# Patient Record
Sex: Male | Born: 1950
Health system: Southern US, Community
[De-identification: ages and names within clinical notes are randomized; demographics above are authoritative.]

## PROBLEM LIST (undated history)

## (undated) DIAGNOSIS — K635 Polyp of colon: Secondary | ICD-10-CM

## (undated) DIAGNOSIS — K5792 Diverticulitis of intestine, part unspecified, without perforation or abscess without bleeding: Secondary | ICD-10-CM

## (undated) DIAGNOSIS — I10 Essential (primary) hypertension: Secondary | ICD-10-CM

## (undated) DIAGNOSIS — E119 Type 2 diabetes mellitus without complications: Secondary | ICD-10-CM

## (undated) HISTORY — PX: FRACTURE SURGERY: SHX138

## (undated) HISTORY — DX: Essential (primary) hypertension: I10

## (undated) HISTORY — PX: APPENDECTOMY: SHX54

## (undated) HISTORY — DX: Diverticulitis of intestine, part unspecified, without perforation or abscess without bleeding: K57.92

## (undated) HISTORY — DX: Polyp of colon: K63.5

---

## 2002-06-09 ENCOUNTER — Emergency Department (HOSPITAL_COMMUNITY): Admission: EM | Admit: 2002-06-09 | Discharge: 2002-06-09 | Payer: Self-pay | Admitting: Emergency Medicine

## 2004-02-26 ENCOUNTER — Ambulatory Visit (HOSPITAL_COMMUNITY): Admission: RE | Admit: 2004-02-26 | Discharge: 2004-02-26 | Payer: Self-pay | Admitting: *Deleted

## 2004-03-26 ENCOUNTER — Ambulatory Visit: Admission: RE | Admit: 2004-03-26 | Discharge: 2004-03-26 | Payer: Self-pay | Admitting: *Deleted

## 2010-02-09 ENCOUNTER — Encounter (INDEPENDENT_AMBULATORY_CARE_PROVIDER_SITE_OTHER): Payer: Self-pay | Admitting: *Deleted

## 2010-03-19 ENCOUNTER — Encounter (INDEPENDENT_AMBULATORY_CARE_PROVIDER_SITE_OTHER): Payer: Self-pay

## 2010-03-24 ENCOUNTER — Ambulatory Visit
Admission: RE | Admit: 2010-03-24 | Discharge: 2010-03-24 | Payer: Self-pay | Source: Home / Self Care | Attending: Gastroenterology | Admitting: Gastroenterology

## 2010-04-06 ENCOUNTER — Other Ambulatory Visit: Payer: Self-pay | Admitting: Gastroenterology

## 2010-04-06 ENCOUNTER — Ambulatory Visit
Admission: RE | Admit: 2010-04-06 | Discharge: 2010-04-06 | Payer: Self-pay | Source: Home / Self Care | Attending: Gastroenterology | Admitting: Gastroenterology

## 2010-04-08 ENCOUNTER — Telehealth: Payer: Self-pay | Admitting: Gastroenterology

## 2010-04-13 ENCOUNTER — Encounter: Payer: Self-pay | Admitting: Gastroenterology

## 2010-04-21 NOTE — Letter (Signed)
Summary: Pre Visit Letter Revised  [facility]  [address] [place], [place] [address]   Phone: [phone]  Fax: [phone]        02/09/2010 MRN: [id number] [facility] [address] [place], [place]  [address]             Procedure Date:  04-06-10   Welcome to the Gastroenterology Division at [facility].    You are scheduled to see a nurse for your pre-procedure visit on 03-24-10 at 10:00a.m. on the 3rd floor at [facility], [address]  We ask that you try to arrive at our office 15 minutes prior to your appointment time to allow for check-in.  Please take a minute to review the attached form.  If you answer "Yes" to one or more of the questions on the first page, we ask that you call the person listed at your earliest opportunity.  If you answer "No" to all of the questions, please complete the rest of the form and bring it to your appointment.    Your nurse visit will consist of discussing your medical and surgical history, your immediate family medical history, and your medications.   If you are unable to list all of your medications on the form, please bring the medication bottles to your appointment and we will list them.  We will need to be aware of both prescribed and over the counter drugs.  We will need to know exact dosage information as well.    Please be prepared to read and sign documents such as consent forms, a financial agreement, and acknowledgement forms.  If necessary, and with your consent, a friend or relative is welcome to sit-in on the nurse visit with you.  Please bring your insurance card so that we may make a copy of it.  If your insurance requires a referral to see a specialist, please bring your referral form from your primary care physician.  No co-pay is required for this nurse visit.     If you cannot keep your appointment, please call [phone] to cancel or reschedule prior to your appointment date.  This  allows [place] the opportunity to schedule an appointment for another patient in need of care.    Thank you for choosing [facility] for your medical needs.  We appreciate the opportunity to care for you.  Please visit [place] at our website  to learn more about our practice.  Sincerely, The Gastroenterology Division

## 2010-04-23 NOTE — Letter (Signed)
Summary: Results Letter  [facility]  [address] [place], [place] [address]   Phone: [phone]  Fax: [phone]        April 13, 2010 MRN: [id number]    OMAURI BOEVE [address] [place], [place]  [address]    Dear Mr. MATSUMOTO,   The polyps removed during your recent procedure were proven to be adenomatous.  These are pre-cancerous polyps that may have grown into cancers if they had not been removed.  Based on current nationally recognized surveillance guidelines, I recommend that you have a repeat colonoscopy in 3 years.   We will therefore put your information in our reminder system and will contact you in 3 years to schedule a repeat procedure.  Please call if you have any questions or concerns.       Sincerely,  Rachael Fee MD  This letter has been electronically signed by your physician.  Appended Document: Results Letter Letter mailed

## 2010-04-23 NOTE — Procedures (Addendum)
Summary: Colonoscopy  Patient: Keith Day Note: All result statuses are Final unless otherwise noted.  Tests: (1) Colonoscopy (COL)   COL Colonoscopy           DONE     [facility]     [address]     [place], [place]  [address]           COLONOSCOPY PROCEDURE REPORT           PATIENT:  Keith, Day  MR#:  [id number]     BIRTHDATE:  04-09-1950, 59 yrs. old  GENDER:  male     ENDOSCOPIST:  Rachael Fee, MD     REF. BY:  Lynnea Ferrier, M.D.     PROCEDURE DATE:  04/06/2010     PROCEDURE:  Colonoscopy with snare polypectomy     ASA CLASS:  Class II     INDICATIONS:  Routine Risk Screening     MEDICATIONS:   Fentanyl 75 mcg IV, Versed 10 mg IV           DESCRIPTION OF PROCEDURE:   After the risks benefits and     alternatives of the procedure were thoroughly explained, informed     consent was obtained.  Digital rectal exam was performed and     revealed no rectal masses.   The LB PCF-[id number] endoscope     was introduced through the anus and advanced to the cecum, which     was identified by both the appendix and ileocecal valve, without     limitations.  The quality of the prep was good, using MoviPrep.     The instrument was then slowly withdrawn as the colon was fully     examined.     <<PROCEDUREIMAGES>>     FINDINGS:  Four polyps were found, all were removed and all sent     to pathology. One was 3mm across, sessile, transverse colon,     removed with cold snare, pathology jar 1. One was 5mm across,     sessile, descending colon, removed with cold snare, path jar 1.     One was 12mm across, pedunculated, sigmoid colon, removed with     snare/cautery, path jar 2. The last was 25mm, sigmoid colon,     pedunculated, removed with snare/cautery, path jar 3 (see image5,     image7, image8, and image9).  A nodule was found. There was a 5mm     nodule distal to dentate lin, did not appear neoplastic (anal     canal). This was not sampled (see image10).  This  was otherwise a     normal examination of the colon (see image3 and image4).     Retroflexed views in the rectum revealed no abnormalities.    The     scope was then withdrawn from the patient and the procedure     completed.           COMPLICATIONS:  None     ENDOSCOPIC IMPRESSION:     1) Four polyps, all removed and all sent to pathology     2) Anal nodule, did not appear neoplastic but this should be     removed or at least biopsied.     3) Otherwise normal examination           RECOMMENDATIONS:     1) If the polyp(s) removed today are proven to be adenomatous     (pre-cancerous) polyps, you will need a colonoscopy  in 3 years.     Otherwise you should continue to follow colorectal cancer     screening guidelines for "routine risk" patients with a     colonoscopy in 10 years.     2) You will receive a letter within 1-2 weeks with the results     of your biopsy as well as final recommendations. Please call my     office if you have not received a letter after 3 weeks.     3) Dr. Christella Hartigan office will arrange referral to general surgery to     evaluate the anal nodule (biopsy, remove).           ______________________________     Rachael Fee, MD           n.     eSIGNED:   Rachael Fee at 04/06/2010 10:43 AM           Keith Day, [id number]  Note: An exclamation mark (!) indicates a result that was not dispersed into the flowsheet. Document Creation Date: 04/06/2010 10:43 AM _______________________________________________________________________  (1) Order result status: Final Collection or observation date-time: 04/06/2010 10:36 Requested date-time:  Receipt date-time:  Reported date-time:  Referring Physician:   Ordering Physician: Rob Bunting [phone]) Specimen Source:  Source: Launa Grill Order Number: [id number] Lab site:   Appended Document: Colonoscopy     Procedures Next Due Date:    Colonoscopy: 04/2013

## 2010-04-23 NOTE — Progress Notes (Signed)
Summary: Schedule surgical referral  Phone Note Outgoing Call   Summary of Call: Appt. made at [facility] for biopsy and removal of anal nodule with Dr.Gross for 04/20/10.Is to arrive at 3:45 p.m. for 4:15 appt.Message left for pt. to call back QM:VHQI.Records faxed accept for path.report on polyp Initial call taken by: Teryl Lucy RN,  April 08, 2010 12:43 PM  Follow-up for Phone Call        Left message for patient to call back Darcey Nora RN, [facility]  April 09, 2010 9:15 AM  Left message for patient to call back Darcey Nora RN, [facility]  April 09, 2010 5:02 PM   I spoke with the patient's wife this am. Relayed appointment information to her.  She will pass the information on to the patient  Follow-up by: Darcey Nora RN, CGRN,  April 10, 2010 9:17 AM

## 2010-04-23 NOTE — Letter (Signed)
Summary: [facility] Instructions  [facility]  [address] [place], [place] [address]   Phone: [phone]  Fax: [phone]       Keith Day    29-Mar-1950    MRN: [id number]        Procedure Day /Date: Monday 04/06/2010     Arrival Time: 9:30 am     Procedure Time:  10:30 am     Location of Procedure:                    _x _  [facility] (4th Floor)                        PREPARATION FOR COLONOSCOPY WITH MOVIPREP   Starting 5 days prior to your procedure Wednesday 1/11 do not eat nuts, seeds, popcorn, corn, beans, peas,  salads, or any raw vegetables.  Do not take any fiber supplements (e.g. Metamucil, Citrucel, and Benefiber).  THE DAY BEFORE YOUR PROCEDURE         DATE: Sunday 1/15  1.  Drink clear liquids the entire day-NO SOLID FOOD  2.  Do not drink anything colored red or purple.  Avoid juices with pulp.  No orange juice.  3.  Drink at least 64 oz. (8 glasses) of fluid/clear liquids during the day to prevent dehydration and help the prep work efficiently.  CLEAR LIQUIDS INCLUDE: Water Jello Ice Popsicles Tea (sugar ok, no milk/cream) Powdered fruit flavored drinks Coffee (sugar ok, no milk/cream) Gatorade Juice: apple, white grape, white cranberry  Lemonade Clear bullion, consomm, broth Carbonated beverages (any kind) Strained chicken noodle soup Hard Candy                             4.  In the morning, mix first dose of MoviPrep solution:    Empty 1 Pouch A and 1 Pouch B into the disposable container    Add lukewarm drinking water to the top line of the container. Mix to dissolve    Refrigerate (mixed solution should be used within 24 hrs)  5.  Begin drinking the prep at 5:00 p.m. The MoviPrep container is divided by 4 marks.   Every 15 minutes drink the solution down to the next mark (approximately 8 oz) until the full liter is complete.   6.  Follow completed prep with 16 oz of clear liquid of your choice (Nothing  red or purple).  Continue to drink clear liquids until bedtime.  7.  Before going to bed, mix second dose of MoviPrep solution:    Empty 1 Pouch A and 1 Pouch B into the disposable container    Add lukewarm drinking water to the top line of the container. Mix to dissolve    Refrigerate  THE DAY OF YOUR PROCEDURE      DATE: Monday 1/16  Beginning at 5:30 a.m. (5 hours before procedure):         1. Every 15 minutes, drink the solution down to the next mark (approx 8 oz) until the full liter is complete.  2. Follow completed prep with 16 oz. of clear liquid of your choice.    3. You may drink clear liquids until 8:30 am (2 HOURS BEFORE PROCEDURE).   MEDICATION INSTRUCTIONS  Unless otherwise instructed, you should take regular prescription medications with a small sip of water   as early as possible the morning of your  procedure.         OTHER INSTRUCTIONS  You will need a responsible adult at least 61 years of age to accompany you and drive you home.   This person must remain in the waiting room during your procedure.  Wear loose fitting clothing that is easily removed.  Leave jewelry and other valuables at home.  However, you may wish to bring a book to read or  an iPod/MP3 player to listen to music as you wait for your procedure to start.  Remove all body piercing jewelry and leave at home.  Total time from sign-in until discharge is approximately 2-3 hours.  You should go home directly after your procedure and rest.  You can resume normal activities the  day after your procedure.  The day of your procedure you should not:   Drive   Make legal decisions   Operate machinery   Drink alcohol   Return to work  You will receive specific instructions about eating, activities and medications before you leave.    The above instructions have been reviewed and explained to me by   Ulis Rias RN  March 24, 2010 10:30 AM     I fully understand and can  verbalize these instructions _____________________________ Date _________

## 2010-04-23 NOTE — Miscellaneous (Signed)
Summary: Lec previsit  Clinical Lists Changes  Observations: Added new observation of NKA: T (03/24/2010 9:57) Called into [place] pharmacy at [facility] [phone]) Moviprep #1. Take as directed.No refills

## 2010-08-07 NOTE — Op Note (Signed)
NAMEDAY, DEERY NO.:  [id number]   MEDICAL RECORD NO.:  [id number]          PATIENT TYPE:  OUT   LOCATION:  CARD                         FACILITY:  [facility]   PHYSICIAN:  Oley Balm. Sung Amabile, M.D. [facility] OF BIRTH:  December 31, 1950   DATE OF PROCEDURE:  03/26/2004  DATE OF DISCHARGE:  03/26/2004                                 OPERATIVE REPORT   INDICATIONS:  Exertional dyspnea.   DESCRIPTION OF PROCEDURE:  Cardiopulmonary stress testing was performed on a  treadmill.  Testing was stopped due to dyspnea and heart rate goal after it  was maximal.  At peak exercise oxygen uptake was 2.88 liters per minute or  117% predicted of maximum, indicating normal exercise tolerance.   At peak exercise heart rate was 162 beats per minute or 100% of predicted  maximum indicating that cardiovascular limitation was reached.  Oxygen pulse  was normal suggesting normal left ventricular function.  Blood pressure  response was normal.  EKG tracings revealed no arrhythmias and no definite  ischemic changes.  There was a slightly delayed heart rate recovery after  exercise.   At peak exercise, minute ventilation was 109 liters per minute or 92% of  predicted maximum indicating that ventilatory limitation was reached.  Gas  exchange parameters revealed no abnormalities.  Baseline spirometry revealed  mild obstruction.  Post exercise spirometry revealed no evidence of exercise  induced bronchospasm.   SUMMARY:  Normal exercise tolerance within essentially normal  cardiopulmonary response to exercise.  However, heart rate recovery was  somewhat delayed which can sometimes be an indicator of ischemic heart  disease.  Otherwise no evidence of cardiac pathology.  There was also mild  obstruction on baseline spirometry but no evidence of exercise induced  bronchospasm.      DBS/MEDQ  D:  04/20/2004  T:  04/20/2004  Job:  [id number]   cc:   Caryl Comes. Slotnick, M.D.  [address] [place]  [place] [address]  Fax: [phone]

## 2012-06-12 ENCOUNTER — Telehealth: Payer: Self-pay | Admitting: Physician Assistant

## 2012-06-12 DIAGNOSIS — I10 Essential (primary) hypertension: Secondary | ICD-10-CM

## 2012-06-12 MED ORDER — LISINOPRIL 10 MG PO TABS: 10.0000 mg | ORAL_TABLET | Freq: Every day | ORAL | Status: DC

## 2012-06-12 NOTE — Telephone Encounter (Signed)
Medication refilled per protocol.Patient needs to be seen before any further refills 

## 2013-01-29 ENCOUNTER — Encounter: Payer: Self-pay | Admitting: Family Medicine

## 2013-01-29 ENCOUNTER — Other Ambulatory Visit: Payer: Self-pay | Admitting: Physician Assistant

## 2013-02-02 ENCOUNTER — Encounter: Payer: Self-pay | Admitting: Gastroenterology

## 2013-02-21 ENCOUNTER — Ambulatory Visit (INDEPENDENT_AMBULATORY_CARE_PROVIDER_SITE_OTHER): Payer: BC Managed Care – PPO | Admitting: Physician Assistant

## 2013-02-21 ENCOUNTER — Encounter: Payer: Self-pay | Admitting: Physician Assistant

## 2013-02-21 VITALS — BP 134/80 | HR 84 | Temp 97.5°F | Resp 18 | Wt 259.0 lb

## 2013-02-21 DIAGNOSIS — J988 Other specified respiratory disorders: Secondary | ICD-10-CM

## 2013-02-21 DIAGNOSIS — A499 Bacterial infection, unspecified: Secondary | ICD-10-CM

## 2013-02-21 MED ORDER — AZITHROMYCIN 250 MG PO TABS: ORAL_TABLET | ORAL | Status: DC

## 2013-02-21 NOTE — Progress Notes (Signed)
    Patient ID: Keith Day MRN: [id number], DOB: 12-Jul-1950, 62 y.o. Date of Encounter: 02/21/2013, 5:45 PM    Chief Complaint:  Chief Complaint  Patient presents with  . cough,congestion x 3 days    neds labs to refill BP med     HPI: 62 y.o. year old white male reports chest congestion with cough. As well he feels a little bit stuffed up in his nasal area. He has had no significant sore throat and no ear pain. No fevers or chills.     Home Meds: See attached medication section for any medications that were entered at today's visit. The computer does not put those onto this list.The following list is a list of meds entered prior to today's visit.   Current Outpatient Prescriptions on File Prior to Visit  Medication Sig Dispense Refill  . lisinopril (PRINIVIL,ZESTRIL) 10 MG tablet TAKE ONE TABLET BY MOUTH ONCE DAILY  90 tablet  0   No current facility-administered medications on file prior to visit.    Allergies: No Known Allergies    Review of Systems: See HPI for pertinent ROS. All other ROS negative.    Physical Exam: Blood pressure 134/80, pulse 84, temperature 97.5 F (36.4 C), temperature source Oral, resp. rate 18, weight 259 lb (117.482 kg)., There is no height on file to calculate BMI. General:  WM. Appears in no acute distress. HEENT: Normocephalic, atraumatic, eyes without discharge, sclera non-icteric, nares are without discharge. Bilateral auditory canals clear, TM's are without perforation,Left TM: The inferior portion is somewhat tall and he superior portion does have some areas of red inflammation. Right TM is pearly grey and translucent with reflective cone of light. Oral cavity moist, posterior pharynx without exudate, erythema, peritonsillar abscess, or post nasal drip.  Neck: Supple. No thyromegaly. No lymphadenopathy. Lungs: Clear bilaterally to auscultation without wheezes, rales, or rhonchi. Breathing is unlabored. Heart: Regular rhythm. No murmurs,  rubs, or gallops. Msk:  Strength and tone normal for age. Extremities/Skin: Warm and dry. No clubbing or cyanosis. No edema. No rashes or suspicious lesions. Neuro: Alert and oriented X 3. Moves all extremities spontaneously. Gait is normal. CNII-XII grossly in tact. Psych:  Responds to questions appropriately with a normal affect.     ASSESSMENT AND PLAN:  62 y.o. year old male with  1. Bacterial respiratory infection - azithromycin (ZITHROMAX) 250 MG tablet; Day 1: Take 2 daily.  Days 2-5: Take 1 daily.  Dispense: 6 tablet; Refill: 0 Mucinex DM if expectorant. Follow up if symptoms do not resolve.  Attention was that he wanted to do lab work today to get refills on his blood pressure medication. However after further conversation he wanted to do labs to include his cholesterol et Karie Soda. He is not fasting. I offered to do just at [facility] so that we can refill his blood pressure medicines. However he says he wants to wait until he can have his full panel. He is to schedule a complete physical exam early morning and come fasting to that appointment.  [address] [place], [place], [facility] 02/21/2013 5:45 PM

## 2013-03-01 ENCOUNTER — Ambulatory Visit (INDEPENDENT_AMBULATORY_CARE_PROVIDER_SITE_OTHER): Payer: BC Managed Care – PPO | Admitting: Physician Assistant

## 2013-03-01 ENCOUNTER — Encounter: Payer: Self-pay | Admitting: Physician Assistant

## 2013-03-01 VITALS — BP 138/80 | HR 84 | Temp 97.4°F | Resp 18 | Ht 69.5 in | Wt 247.0 lb

## 2013-03-01 DIAGNOSIS — Z125 Encounter for screening for malignant neoplasm of prostate: Secondary | ICD-10-CM

## 2013-03-01 DIAGNOSIS — Z1211 Encounter for screening for malignant neoplasm of colon: Secondary | ICD-10-CM

## 2013-03-01 DIAGNOSIS — Z0000 Encounter for general adult medical examination without abnormal findings: Secondary | ICD-10-CM

## 2013-03-01 DIAGNOSIS — K5792 Diverticulitis of intestine, part unspecified, without perforation or abscess without bleeding: Secondary | ICD-10-CM | POA: Insufficient documentation

## 2013-03-01 DIAGNOSIS — Z23 Encounter for immunization: Secondary | ICD-10-CM

## 2013-03-01 DIAGNOSIS — K5732 Diverticulitis of large intestine without perforation or abscess without bleeding: Secondary | ICD-10-CM

## 2013-03-01 DIAGNOSIS — K635 Polyp of colon: Secondary | ICD-10-CM | POA: Insufficient documentation

## 2013-03-01 DIAGNOSIS — I10 Essential (primary) hypertension: Secondary | ICD-10-CM

## 2013-03-01 DIAGNOSIS — D126 Benign neoplasm of colon, unspecified: Secondary | ICD-10-CM

## 2013-03-01 LAB — LIPID PANEL
HDL: 48 mg/dL (ref >39)
LDL Cholesterol: 127 mg/dL — ABNORMAL HIGH (ref 0–99)
Total CHOL/HDL Ratio: 4.5 Ratio
Triglycerides: 194 mg/dL — ABNORMAL HIGH (ref <150)

## 2013-03-01 LAB — CBC WITH DIFFERENTIAL/PLATELET
Basophils Absolute: 0 10*3/uL (ref 0.0–0.1)
Basophils Relative: 0 % (ref 0–1)
Eosinophils Absolute: 0.1 10*3/uL (ref 0.0–0.7)
Eosinophils Relative: 1 % (ref 0–5)
HCT: 47 % (ref 39.0–52.0)
Hemoglobin: 16.6 g/dL (ref 13.0–17.0)
Lymphocytes Relative: 28 % (ref 12–46)
Lymphs Abs: 1.8 10*3/uL (ref 0.7–4.0)
MCH: 30.4 pg (ref 26.0–34.0)
MCHC: 35.3 g/dL (ref 30.0–36.0)
MCV: 86.1 fL (ref 78.0–100.0)
Monocytes Absolute: 0.5 10*3/uL (ref 0.1–1.0)
Monocytes Relative: 8 % (ref 3–12)
Neutro Abs: 4 10*3/uL (ref 1.7–7.7)
Neutrophils Relative %: 63 % (ref 43–77)
Platelets: 249 10*3/uL (ref 150–400)
RBC: 5.46 MIL/uL (ref 4.22–5.81)
RDW: 13.9 % (ref 11.5–15.5)
WBC: 6.4 10*3/uL (ref 4.0–10.5)

## 2013-03-01 LAB — COMPLETE METABOLIC PANEL WITH GFR
ALT: 35 U/L (ref 0–53)
AST: 29 U/L (ref 0–37)
Albumin: 4.8 g/dL (ref 3.5–5.2)
Alkaline Phosphatase: 54 U/L (ref 39–117)
BUN: 18 mg/dL (ref 6–23)
CO2: 25 mEq/L (ref 19–32)
Calcium: 10 mg/dL (ref 8.4–10.5)
Chloride: 104 mEq/L (ref 96–112)
Creat: 1.03 mg/dL (ref 0.50–1.35)
GFR, Est African American: 89 mL/min
GFR, Est Non African American: 77 mL/min
Glucose, Bld: 104 mg/dL — ABNORMAL HIGH (ref 70–99)
Potassium: 4.1 mEq/L (ref 3.5–5.3)
Sodium: 140 mEq/L (ref 135–145)
Total Bilirubin: 0.8 mg/dL (ref 0.3–1.2)
Total Protein: 7.7 g/dL (ref 6.0–8.3)

## 2013-03-01 NOTE — Progress Notes (Signed)
Patient ID: Keith Day MRN: [id number], DOB: 06-21-1950 62 y.o. Date of Encounter: 03/01/2013, 3:27 PM    Chief Complaint: Physical (CPE)  HPI: 62 y.o. y/o white male here for CPE.   When reviewing prior notes, reviewed that we had recommended/discussed diet changes in past. He had told me that his wife had been dxed with DM and was going to Diabetic Education. 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   Says he knows why liver numbers were high at past check--b/c of alcohol. But, says he has not cut back. Says he did try to trade in the liquor for wine on some days.  Says that every night he has either 12 ounce drink with liquor in it or he drinks wine-a half a bottle. Says he drinks the same amount on weekends.   No complaints today.  Saw me 02/21/13 with URI--Rxed ZPack. Pt says still has some phlegm but says it is much better than it was.    Review of Systems: Consitutional: No fever, chills, fatigue, night sweats, lymphadenopathy, or weight changes. Eyes: No visual changes, eye redness, or discharge. ENT/Mouth: Ears: No otalgia, tinnitus, hearing loss, discharge. Nose: No congestion, rhinorrhea, sinus pain, or epistaxis. Throat: No sore throat, post nasal drip, or teeth pain. Cardiovascular: No CP, palpitations, diaphoresis, DOE, edema, orthopnea, PND. Respiratory: No cough, hemoptysis, SOB, or wheezing. Gastrointestinal: No anorexia, dysphagia, reflux, pain, nausea, vomiting, hematemesis, diarrhea, constipation, BRBPR, or melena. Genitourinary: No dysuria, frequency, urgency, hematuria, incontinence, nocturia, decreased urinary stream, discharge, impotence, or testicular  pain/masses. Musculoskeletal: No decreased ROM, myalgias, stiffness, joint swelling, or weakness. Skin: No rash, erythema, lesion changes, pain, warmth, jaundice, or pruritis. Neurological: No headache, dizziness, syncope, seizures, tremors, memory loss, coordination problems, or paresthesias. Psychological: No anxiety, depression, hallucinations, SI/HI. Endocrine: No fatigue, polydipsia, polyphagia, polyuria, or known diabetes. All other systems were reviewed and are otherwise negative.  Past Medical History  Diagnosis Date  . Hypertension   . Diverticulitis   . Colon polyps      Past Surgical History  Procedure Laterality Date  . Appendectomy    . Fracture surgery      Home Meds:  Current Outpatient Prescriptions on File Prior to Visit  Medication Sig Dispense Refill  . lisinopril (PRINIVIL,ZESTRIL) 10 MG tablet TAKE ONE TABLET BY MOUTH ONCE DAILY  90 tablet  0   No current facility-administered medications on file prior to visit.    Allergies: No Known Allergies  History   Social History  . Marital Status: Married    Spouse Name: N/A    Number of Children: N/A  . Years of Education: N/A   Occupational History  . Not on file.   Social History Main Topics  . Smoking status: Former Smoker    Quit date: 02/21/1998  . Smokeless tobacco: Never Used  . Alcohol Use: Yes  . Drug Use: No  . Sexual Activity: Not on file   Other Topics Concern  . Not on file   Social History Narrative  . No narrative on file    Family History  Problem Relation Age of Onset  . Cancer Father 44    Lung  Cancer    Physical Exam: Blood pressure 138/80, pulse 84, temperature 97.4 F (36.3 C), temperature source Oral, resp. rate 18, height 5' 9.5" (1.765 m), weight 247 lb (112.038 kg).  General: Mod abdominal obesity. WM. Appears in no acute distress. HEENT: Normocephalic, atraumatic. Conjunctiva pink, sclera non-icteric. Pupils 2 mm constricting to 1 mm, round, regular, and equally  reactive to light and accomodation. EOMI. Internal auditory canal clear. TMs with good cone of light and without pathology. Nasal mucosa pink. Nares are without discharge. No sinus tenderness. Oral mucosa pink. . Pharynx without exudate.   Neck: Supple. Trachea midline. No thyromegaly. Full ROM. No lymphadenopathy. Lungs: Clear to auscultation bilaterally without wheezes, rales, or rhonchi. Breathing is of normal effort and unlabored. Cardiovascular: RRR with S1 S2. No murmurs, rubs, or gallops. Distal pulses 2+ symmetrically. No carotid or abdominal bruits. Abdomen: Soft, non-tender, non-distended with normoactive bowel sounds. No hepatosplenomegaly or masses. No rebound/guarding. No CVA tenderness. No hernias. Rectal: Pt defers. Says cna just check this with lab work. Refuses exam.  Musculoskeletal: Full range of motion and 5/5 strength throughout. Without swelling, atrophy, tenderness, crepitus, or warmth. Extremities without clubbing, cyanosis, or edema. Calves supple. Skin: Warm and moist without erythema, ecchymosis, wounds, or rash. Neuro: A+Ox3. CN II-XII grossly intact. Moves all extremities spontaneously. Full sensation throughout. Normal gait. DTR 2+ throughout upper and lower extremities. Finger to nose intact. Psych:  Responds to questions appropriately with a normal affect.   Assessment/Plan:  62 y.o. y/o  male here for CPE -1. Visit for preventive health examination  A. Screening Labs:  Check now. He is fasting.  Prostate cancer screening: He refuses exam today. Check PSA now.  Colorectal cancer screening: Last colonoscopy was 04/16/10. Due to repeat 3 years. This is due January 2015. I discussed this with him today. I gave him the phone number for him to call to schedule this himself. He is agreeable to followup with this.  Immunizations: Influenza vaccine given today. TD was given 06/2009 Pneumonia vaccine given 07/2001 ConstaVacs: He will check with his insurance regarding  cost in followup.  2. Hypertension Controlled. Continue current medication. Check lab to monitor.  3. History of hyperlipidemia: Patient reports it is not realistic to think he is going to make diet changes. If cholesterol is elevated we'll need to prescribe medication.  4. History of elevated LFTs. Hepatitis panel was added and was negative 06/08/2011. 05/2011 AST was 61 and ALT was 142. Other LFTs were normal at that time. 5. Alcohol use: Patient reports that he notices the alcohol use as this caused the elevated LFTs.  However he has not reduced his alcohol intake and says that he does not have plans to do so. He is aware of the effect on the liver as well as other adverse effects to his health. 6. History of hyperglycemia: Check A1c   02/21/13-Rxed ZPack.  Told him to give it few more days. At that time, if phlegm still not resolved, call me and I will Rx another abx.   Signed:   [address] [place], [place]  03/01/2013 3:27 PM

## 2013-03-02 LAB — PSA: PSA: 0.34 ng/mL (ref <=4.00)

## 2013-03-07 ENCOUNTER — Telehealth: Payer: Self-pay | Admitting: Family Medicine

## 2013-03-07 MED ORDER — LISINOPRIL 10 MG PO TABS: 10.0000 mg | ORAL_TABLET | Freq: Every day | ORAL | Status: DC

## 2013-03-07 NOTE — Telephone Encounter (Signed)
Pt aware of lab results and provider recommendations.  Refill BP med to pharmacy.

## 2013-03-07 NOTE — Telephone Encounter (Signed)
Message copied by Donne Anon on Wed Mar 07, 2013 10:16 AM ------      Message from: Allayne Butcher      Created: Mon Mar 05, 2013  3:03 PM       I got.Voicemail. Left message for patient to call us back to discuss lab results. ------

## 2013-08-21 ENCOUNTER — Encounter: Payer: Self-pay | Admitting: Gastroenterology

## 2014-05-14 ENCOUNTER — Ambulatory Visit (INDEPENDENT_AMBULATORY_CARE_PROVIDER_SITE_OTHER): Payer: BLUE CROSS/BLUE SHIELD | Admitting: Family Medicine

## 2014-05-14 ENCOUNTER — Encounter: Payer: Self-pay | Admitting: Family Medicine

## 2014-05-14 ENCOUNTER — Telehealth: Payer: Self-pay | Admitting: Physician Assistant

## 2014-05-14 VITALS — BP 136/82 | HR 82 | Temp 98.3°F | Resp 18 | Ht 71.0 in | Wt 259.0 lb

## 2014-05-14 DIAGNOSIS — G5601 Carpal tunnel syndrome, right upper limb: Secondary | ICD-10-CM

## 2014-05-14 DIAGNOSIS — M5431 Sciatica, right side: Secondary | ICD-10-CM

## 2014-05-14 DIAGNOSIS — M7551 Bursitis of right shoulder: Secondary | ICD-10-CM

## 2014-05-14 DIAGNOSIS — I10 Essential (primary) hypertension: Secondary | ICD-10-CM

## 2014-05-14 LAB — LIPID PANEL
Cholesterol: 228 mg/dL — ABNORMAL HIGH (ref 0–200)
HDL: 48 mg/dL (ref >=40)
LDL CALC: 126 mg/dL — AB (ref 0–99)
TRIGLYCERIDES: 270 mg/dL — AB (ref <150)
Total CHOL/HDL Ratio: 4.8 Ratio
VLDL: 54 mg/dL — AB (ref 0–40)

## 2014-05-14 LAB — COMPLETE METABOLIC PANEL WITH GFR
ALBUMIN: 4.5 g/dL (ref 3.5–5.2)
ALT: 26 U/L (ref 0–53)
AST: 23 U/L (ref 0–37)
Alkaline Phosphatase: 62 U/L (ref 39–117)
BUN: 13 mg/dL (ref 6–23)
CHLORIDE: 103 meq/L (ref 96–112)
CO2: 25 meq/L (ref 19–32)
CREATININE: 1.09 mg/dL (ref 0.50–1.35)
Calcium: 9.7 mg/dL (ref 8.4–10.5)
GFR, EST AFRICAN AMERICAN: 82 mL/min
GFR, EST NON AFRICAN AMERICAN: 71 mL/min
Glucose, Bld: 104 mg/dL — ABNORMAL HIGH (ref 70–99)
POTASSIUM: 4.4 meq/L (ref 3.5–5.3)
Sodium: 139 mEq/L (ref 135–145)
TOTAL PROTEIN: 7.4 g/dL (ref 6.0–8.3)
Total Bilirubin: 0.5 mg/dL (ref 0.2–1.2)

## 2014-05-14 MED ORDER — LISINOPRIL 10 MG PO TABS: 10.0000 mg | ORAL_TABLET | Freq: Every day | ORAL | Status: DC

## 2014-05-14 NOTE — Addendum Note (Signed)
Addended by: Shary Decamp B on: 05/14/2014 10:41 AM   Modules accepted: Orders

## 2014-05-14 NOTE — Progress Notes (Signed)
Subjective:    Patient ID: Keith Day, male    DOB: [birth year], 64 y.o.   MRN: [id number]  HPI Patient has a history of hypertension and hyperlipidemia. His blood pressures well controlled on lisinopril 10 mg by mouth daily. He denies any chest pain shortness of breath or dyspnea on exertion. He is overdue for fasting lipid panel. He also complains of bilateral shoulder pain. It hurts to raise his arms above his head. It aches to lay on his shoulder at night. His shoulders are aching and throbbing throughout the day. They're worse with overhead activity. He also has numbness and tingling in both hands. He has pain radiating from his wrist into his first second and third fingers. He also complains of numbness and tingling radiating from his right gluteus down his right leg into his foot. This primarily occurs when he is sitting and driving heavy equipment. It does not occur at any other time. He also denies any low back pain. Past Medical History  Diagnosis Date  . Hypertension   . Diverticulitis   . Colon polyps    Past Surgical History  Procedure Laterality Date  . Appendectomy    . Fracture surgery     Current Outpatient Prescriptions on File Prior to Visit  Medication Sig Dispense Refill  . lisinopril (PRINIVIL,ZESTRIL) 10 MG tablet Take 1 tablet (10 mg total) by mouth daily. 90 tablet 3   No current facility-administered medications on file prior to visit.   No Known Allergies History   Social History  . Marital Status: Married    Spouse Name: N/A  . Number of Children: N/A  . Years of Education: N/A   Occupational History  . Not on file.   Social History Main Topics  . Smoking status: Former Smoker    Quit date: 02/21/1998  . Smokeless tobacco: Never Used  . Alcohol Use: Yes  . Drug Use: No  . Sexual Activity: Not on file   Other Topics Concern  . Not on file   Social History Narrative      Review of Systems  All other systems reviewed and are  negative.      Objective:   Physical Exam  Constitutional: He is oriented to person, place, and time. He appears well-developed and well-nourished.  Eyes: No scleral icterus.  Neck: Neck supple. No JVD present.  Cardiovascular: Normal rate, regular rhythm and normal heart sounds.  Exam reveals no gallop and no friction rub.   No murmur heard. Pulmonary/Chest: Effort normal and breath sounds normal. No respiratory distress. He has no wheezes. He has no rales.  Abdominal: Soft. Bowel sounds are normal. He exhibits no distension. There is no tenderness. There is no rebound.  Musculoskeletal:       Right shoulder: He exhibits decreased range of motion, tenderness, bony tenderness and pain.  Lymphadenopathy:    He has no cervical adenopathy.  Neurological: He is oriented to person, place, and time. He has normal reflexes. No cranial nerve deficit. He exhibits normal muscle tone. Coordination normal.  Nursing note and vitals reviewed.         Assessment & Plan:  Benign essential HTN - Plan: COMPLETE METABOLIC PANEL WITH GFR, Lipid panel  Carpal tunnel syndrome of right wrist  Subacromial bursitis, right  Sciatica, right  Patient's blood pressures well controlled. I will also check a fasting lipid panel. Goal LDL cholesterol is less than 100. I believe he has carpal tunnel syndrome in his wrist. I believe  he has subacromial bursitis in both shoulders and rotator cuff tendinitis. I also believe he is having sciatica in his right hip. I recommended a thick cushion to sit when he is driving heavy equipment to see if that would help. I believe this is most likely due to piriformis syndrome. I injected his right shoulder using a mixture of  2 mL of 0.1% lidocaine, 2 mL of Marcaine, and 2 mL of 40 mg per mL Kenalog using sterile technique. If the patient shoulder pain improves we can do the same thing to his left shoulder. I believe he has carpal tunnel in his wrist. We could also perform a  cortisone shot in his wrist and shoulder improves. If his shoulder does not improve I would recommend nerve conduction studies of the upper extremities.

## 2014-05-20 ENCOUNTER — Other Ambulatory Visit: Payer: Self-pay | Admitting: Family Medicine

## 2014-05-20 MED ORDER — ATORVASTATIN CALCIUM 20 MG PO TABS: 20.0000 mg | ORAL_TABLET | Freq: Every day | ORAL | Status: DC

## 2014-09-24 ENCOUNTER — Encounter: Payer: Self-pay | Admitting: Family Medicine

## 2014-09-24 ENCOUNTER — Other Ambulatory Visit: Payer: Self-pay | Admitting: Family Medicine

## 2014-09-24 NOTE — Telephone Encounter (Signed)
Medication refill for one time only.  Patient needs to be seen.  Letter sent for patient to call and schedule 

## 2014-09-26 ENCOUNTER — Emergency Department (HOSPITAL_COMMUNITY): Payer: BC Managed Care – PPO

## 2014-09-26 ENCOUNTER — Encounter (HOSPITAL_COMMUNITY): Payer: Self-pay | Admitting: Emergency Medicine

## 2014-09-26 ENCOUNTER — Emergency Department (HOSPITAL_COMMUNITY)
Admission: EM | Admit: 2014-09-26 | Discharge: 2014-09-26 | Disposition: A | Discharge status: Home / Self Care | Payer: BC Managed Care – PPO | Attending: Emergency Medicine | Admitting: Emergency Medicine

## 2014-09-26 DIAGNOSIS — Y9389 Activity, other specified: Secondary | ICD-10-CM | POA: Diagnosis not present

## 2014-09-26 DIAGNOSIS — Z86010 Personal history of colonic polyps: Secondary | ICD-10-CM | POA: Diagnosis not present

## 2014-09-26 DIAGNOSIS — Z79899 Other long term (current) drug therapy: Secondary | ICD-10-CM | POA: Insufficient documentation

## 2014-09-26 DIAGNOSIS — Z8719 Personal history of other diseases of the digestive system: Secondary | ICD-10-CM | POA: Diagnosis not present

## 2014-09-26 DIAGNOSIS — Y9289 Other specified places as the place of occurrence of the external cause: Secondary | ICD-10-CM | POA: Diagnosis not present

## 2014-09-26 DIAGNOSIS — W102XXA Fall (on)(from) incline, initial encounter: Secondary | ICD-10-CM

## 2014-09-26 DIAGNOSIS — Z23 Encounter for immunization: Secondary | ICD-10-CM | POA: Diagnosis not present

## 2014-09-26 DIAGNOSIS — S299XXA Unspecified injury of thorax, initial encounter: Secondary | ICD-10-CM | POA: Diagnosis present

## 2014-09-26 DIAGNOSIS — I10 Essential (primary) hypertension: Secondary | ICD-10-CM | POA: Diagnosis not present

## 2014-09-26 DIAGNOSIS — Y998 Other external cause status: Secondary | ICD-10-CM | POA: Insufficient documentation

## 2014-09-26 DIAGNOSIS — S20212A Contusion of left front wall of thorax, initial encounter: Secondary | ICD-10-CM | POA: Insufficient documentation

## 2014-09-26 DIAGNOSIS — Z87891 Personal history of nicotine dependence: Secondary | ICD-10-CM | POA: Insufficient documentation

## 2014-09-26 DIAGNOSIS — S2242XA Multiple fractures of ribs, left side, initial encounter for closed fracture: Secondary | ICD-10-CM | POA: Insufficient documentation

## 2014-09-26 DIAGNOSIS — S0083XA Contusion of other part of head, initial encounter: Secondary | ICD-10-CM | POA: Insufficient documentation

## 2014-09-26 DIAGNOSIS — W1789XA Other fall from one level to another, initial encounter: Secondary | ICD-10-CM | POA: Insufficient documentation

## 2014-09-26 DIAGNOSIS — S2232XA Fracture of one rib, left side, initial encounter for closed fracture: Secondary | ICD-10-CM

## 2014-09-26 DIAGNOSIS — S99921A Unspecified injury of right foot, initial encounter: Secondary | ICD-10-CM | POA: Insufficient documentation

## 2014-09-26 DIAGNOSIS — R55 Syncope and collapse: Secondary | ICD-10-CM | POA: Insufficient documentation

## 2014-09-26 DIAGNOSIS — R05 Cough: Secondary | ICD-10-CM | POA: Insufficient documentation

## 2014-09-26 DIAGNOSIS — S0081XA Abrasion of other part of head, initial encounter: Secondary | ICD-10-CM | POA: Insufficient documentation

## 2014-09-26 HISTORY — DX: Type 2 diabetes mellitus without complications: E11.9

## 2014-09-26 LAB — I-STAT CHEM 8, ED
BUN: 21 mg/dL — ABNORMAL HIGH (ref 6–20)
CALCIUM ION: 1.3 mmol/L (ref 1.13–1.30)
CHLORIDE: 103 mmol/L (ref 101–111)
Creatinine, Ser: 1.2 mg/dL (ref 0.61–1.24)
GLUCOSE: 111 mg/dL — AB (ref 65–99)
HCT: 49 % (ref 39.0–52.0)
Hemoglobin: 16.7 g/dL (ref 13.0–17.0)
Potassium: 4.2 mmol/L (ref 3.5–5.1)
SODIUM: 142 mmol/L (ref 135–145)
TCO2: 24 mmol/L (ref 0–100)

## 2014-09-26 LAB — SAMPLE TO BLOOD BANK

## 2014-09-26 LAB — CBC WITH DIFFERENTIAL/PLATELET
BASOS ABS: 0 10*3/uL (ref 0.0–0.1)
Basophils Relative: 0 % (ref 0–1)
Eosinophils Absolute: 0 10*3/uL (ref 0.0–0.7)
Eosinophils Relative: 0 % (ref 0–5)
HCT: 45.8 % (ref 39.0–52.0)
Hemoglobin: 15.4 g/dL (ref 13.0–17.0)
LYMPHS PCT: 8 % — AB (ref 12–46)
Lymphs Abs: 1.4 10*3/uL (ref 0.7–4.0)
MCH: 29.7 pg (ref 26.0–34.0)
MCHC: 33.6 g/dL (ref 30.0–36.0)
MCV: 88.2 fL (ref 78.0–100.0)
MONO ABS: 1 10*3/uL (ref 0.1–1.0)
MONOS PCT: 6 % (ref 3–12)
NEUTROS PCT: 86 % — AB (ref 43–77)
Neutro Abs: 15.2 10*3/uL — ABNORMAL HIGH (ref 1.7–7.7)
PLATELETS: 168 10*3/uL (ref 150–400)
RBC: 5.19 MIL/uL (ref 4.22–5.81)
RDW: 13.1 % (ref 11.5–15.5)
WBC: 17.6 10*3/uL — ABNORMAL HIGH (ref 4.0–10.5)

## 2014-09-26 LAB — COMPREHENSIVE METABOLIC PANEL
ALT: 32 U/L (ref 17–63)
AST: 40 U/L (ref 15–41)
Albumin: 4 g/dL (ref 3.5–5.0)
Alkaline Phosphatase: 66 U/L (ref 38–126)
Anion gap: 13 (ref 5–15)
BUN: 19 mg/dL (ref 6–20)
CALCIUM: 9.9 mg/dL (ref 8.9–10.3)
CO2: 20 mmol/L — ABNORMAL LOW (ref 22–32)
Chloride: 107 mmol/L (ref 101–111)
Creatinine, Ser: 1.22 mg/dL (ref 0.61–1.24)
GFR calc Af Amer: >60 mL/min (ref >60)
GFR calc non Af Amer: >60 mL/min (ref >60)
Glucose, Bld: 106 mg/dL — ABNORMAL HIGH (ref 65–99)
Potassium: 4.9 mmol/L (ref 3.5–5.1)
SODIUM: 140 mmol/L (ref 135–145)
Total Bilirubin: 1.3 mg/dL — ABNORMAL HIGH (ref 0.3–1.2)
Total Protein: 8.6 g/dL — ABNORMAL HIGH (ref 6.5–8.1)

## 2014-09-26 LAB — PROTIME-INR
INR: 1.01 (ref 0.00–1.49)
PROTHROMBIN TIME: 13.5 s (ref 11.6–15.2)

## 2014-09-26 LAB — ETHANOL: ALCOHOL ETHYL (B): 5 mg/dL — AB (ref <5)

## 2014-09-26 LAB — CDS SEROLOGY

## 2014-09-26 MED ORDER — TETANUS-DIPHTH-ACELL PERTUSSIS 5-2.5-18.5 LF-MCG/0.5 IM SUSP
0.5000 mL | Freq: Once | INTRAMUSCULAR | Status: AC
  Administered 2014-09-26: 0.5 mL via INTRAMUSCULAR
  Filled 2014-09-26: qty 0.5

## 2014-09-26 MED ORDER — IOHEXOL 300 MG/ML  SOLN
75.0000 mL | Freq: Once | INTRAMUSCULAR | Status: AC | PRN
  Administered 2014-09-26: 75 mL via INTRAVENOUS

## 2014-09-26 MED ORDER — IBUPROFEN 600 MG PO TABS: 600.0000 mg | ORAL_TABLET | Freq: Four times a day (QID) | ORAL | Status: DC | PRN

## 2014-09-26 MED ORDER — OXYCODONE-ACETAMINOPHEN 5-325 MG PO TABS: 1.0000 | ORAL_TABLET | ORAL | Status: DC | PRN

## 2014-09-26 NOTE — ED Notes (Signed)
Pt from home for eval of fall from bobcat at about 15 feet, event was witnessed by pt grandson who states pt had LOC for 30 seconds. Upon arrival, EMS noted pt to be axo x4, left eye laceration and hematoma noted, pt refused c-collar and LSB by EMS. Pt given 100 mcg of fentanyl PTA to ED. Pt also reports LUQ abd pain with hematoma noted and tenderness, nad noted upon arrival.

## 2014-09-26 NOTE — ED Provider Notes (Signed)
CSN: [id number]     Arrival date & time 09/26/14  1151 History   First MD Initiated Contact with Patient 09/26/14 1151     Chief Complaint  Patient presents with  . Fall  . Loss of Consciousness     (Consider location/radiation/quality/duration/timing/severity/associated sxs/prior Treatment) Patient is a 64 y.o. male presenting with fall and syncope. The history is provided by the patient and medical records.  Fall Associated symptoms include arthralgias.  Loss of Consciousness   This is a 64 year old male with history of hypertension and diverticulitis, presenting to the ED after a fall. Patient was on the back of bob-cat tractor, hit a bump, and fell approx 15 feet to the ground.  He did have LOC for approx 30 seconds which was witnessed by his son.  Patient currently awake, alert.  Per EMS he refused c-collar and spine board on scene.  He complains of left rib pain, some pain with deep breathing, coughing, and/or movement.  Also has some mild pain of his right heel. No abdominal pain, nausea, vomiting.  Denies any neck pain, headache, visual disturbance, dizziness, changes in speech. No back pain. Denies numbness/weakness of extremities.  No loss of bowel or bladder control.  Patient not currently on any type of anti-coagulation.  Patient was given 100 fentanyl en route with improvement of pain.  VSS.  Past Medical History  Diagnosis Date  . Hypertension   . Diverticulitis   . Colon polyps    Past Surgical History  Procedure Laterality Date  . Appendectomy    . Fracture surgery     Family History  Problem Relation Age of Onset  . Cancer Father 49    Lung Cancer   History  Substance Use Topics  . Smoking status: Former Smoker    Quit date: 02/21/1998  . Smokeless tobacco: Never Used  . Alcohol Use: Yes    Review of Systems  Cardiovascular: Positive for syncope.  Musculoskeletal: Positive for arthralgias.  All other systems reviewed and are negative.     Allergies    Review of patient's allergies indicates no known allergies.  Home Medications   Prior to Admission medications   Medication Sig Start Date End Date Taking? Authorizing Provider  atorvastatin (LIPITOR) 20 MG tablet TAKE ONE TABLET BY MOUTH  DAILY 09/24/14   Susy Frizzle, MD  lisinopril (PRINIVIL,ZESTRIL) 10 MG tablet Take 1 tablet (10 mg total) by mouth daily. 05/14/14   Susy Frizzle, MD   BP 137/51 mmHg  Pulse 90  Resp 14  SpO2 97%   Physical Exam  Constitutional: He is oriented to person, place, and time. He appears well-developed and well-nourished. No distress.  HENT:  Head: Normocephalic. Head is with abrasion.  Right Ear: Tympanic membrane and ear canal normal.  Left Ear: Tympanic membrane and ear canal normal.  Nose: Nose normal.  Mouth/Throat: Uvula is midline and oropharynx is clear and moist.  Abrasions noted to left forehead and left lateral eye; no active bleeding; mild bruising noted without swelling; no hemotympanum panel; mid-face stable; dentition intact  Eyes: Conjunctivae and EOM are normal. Pupils are equal, round, and reactive to light.  Neck: Normal range of motion. Neck supple.  Cardiovascular: Normal rate, regular rhythm and normal heart sounds.   Pulmonary/Chest: Effort normal and breath sounds normal. No respiratory distress. He has no wheezes. He exhibits tenderness and bony tenderness.    Tenderness of left lateral ribs with bruising noted; no crepitus noted  Abdominal: Soft. Bowel sounds are  normal. There is no tenderness. There is no guarding and no CVA tenderness.  Abdomen soft, non-distended, no focal tenderness No CVA tenderness  Musculoskeletal: Normal range of motion.       Cervical back: Normal.       Thoracic back: Normal.       Lumbar back: Normal.  Small puncture wound noted to right heel, no bleeding or signs of retained foreign body, locally tender to palpation  Neurological: He is alert and oriented to person, place, and time.   AAOx3, answering questions appropriately; equal strength UE and LE bilaterally; CN grossly intact; moves all extremities appropriately without ataxia; no focal neuro deficits or facial asymmetry appreciated  Skin: Skin is warm and dry. He is not diaphoretic.  Psychiatric: He has a normal mood and affect.  Nursing note and vitals reviewed.   ED Course  Procedures (including critical care time) Labs Review Labs Reviewed  COMPREHENSIVE METABOLIC PANEL - Abnormal; Notable for the following:    CO2 20 (*)    Glucose, Bld 106 (*)    Total Protein 8.6 (*)    Total Bilirubin 1.3 (*)    All other components within normal limits  ETHANOL - Abnormal; Notable for the following:    Alcohol, Ethyl (B) 5 (*)    All other components within normal limits  CBC WITH DIFFERENTIAL/PLATELET - Abnormal; Notable for the following:    WBC 17.6 (*)    Neutrophils Relative % 86 (*)    Neutro Abs 15.2 (*)    Lymphocytes Relative 8 (*)    All other components within normal limits  I-STAT CHEM 8, ED - Abnormal; Notable for the following:    BUN 21 (*)    Glucose, Bld 111 (*)    All other components within normal limits  CDS SEROLOGY  PROTIME-INR  SAMPLE TO BLOOD BANK    Imaging Review Ct Head Wo Contrast  09/26/2014   CLINICAL DATA:  [place] approximately 15 feet from a large backhoe. Laceration above the left eye. Left zygoma abrasion and bruising. Probable loss of consciousness.  EXAM: CT HEAD WITHOUT CONTRAST  CT CERVICAL SPINE WITHOUT CONTRAST  TECHNIQUE: Multidetector CT imaging of the head and cervical spine was performed following the standard protocol without intravenous contrast. Multiplanar CT image reconstructions of the cervical spine were also generated.  COMPARISON:  None.  FINDINGS: CT HEAD FINDINGS  Minimally enlarged ventricles and subarachnoid spaces. No skull fracture, intracranial hemorrhage or paranasal sinus air-fluid levels.  CT CERVICAL SPINE FINDINGS  Mid cervical spine and upper  thoracic spine degenerative changes. No prevertebral soft tissue swelling, fractures or subluxations. Mild bilateral carotid artery calcifications.  IMPRESSION: 1. No skull fracture, intracranial hemorrhage, cervical spine fracture or subluxation. 2. Minimal diffuse cerebral atrophy. 3. Cervical spine degenerative changes. 4. Mild bilateral carotid artery atheromatous calcifications.   Electronically Signed   By: Claudie Revering M.D.   On: 09/26/2014 13:51   Ct Chest W Contrast  09/26/2014   CLINICAL DATA:  Severe anterior left chest pain after falling 15 feet from a large backhoe today.  EXAM: CT CHEST WITH CONTRAST  TECHNIQUE: Multidetector CT imaging of the chest was performed during intravenous contrast administration.  CONTRAST:  97mL OMNIPAQUE IOHEXOL 300 MG/ML  SOLN  COMPARISON:  Chest x-ray dated 09/26/2014  FINDINGS: There are nondisplaced fractures of left fourth, fifth and sixth ribs anterior laterally. No pneumothorax. No lung contusion. The lungs are clear. The heart and other mediastinal structures are normal. No adenopathy. Slight calcification  in the coronary arteries.  The visualized portion of the upper abdomen is normal.  IMPRESSION: Nondisplaced hairline fractures of the left fourth, fifth and sixth ribs anterior laterally.   Electronically Signed   By: Lorriane Shire M.D.   On: 09/26/2014 13:52   Ct Cervical Spine Wo Contrast  09/26/2014   CLINICAL DATA:  [place] approximately 15 feet from a large backhoe. Laceration above the left eye. Left zygoma abrasion and bruising. Probable loss of consciousness.  EXAM: CT HEAD WITHOUT CONTRAST  CT CERVICAL SPINE WITHOUT CONTRAST  TECHNIQUE: Multidetector CT imaging of the head and cervical spine was performed following the standard protocol without intravenous contrast. Multiplanar CT image reconstructions of the cervical spine were also generated.  COMPARISON:  None.  FINDINGS: CT HEAD FINDINGS  Minimally enlarged ventricles and subarachnoid spaces. No  skull fracture, intracranial hemorrhage or paranasal sinus air-fluid levels.  CT CERVICAL SPINE FINDINGS  Mid cervical spine and upper thoracic spine degenerative changes. No prevertebral soft tissue swelling, fractures or subluxations. Mild bilateral carotid artery calcifications.  IMPRESSION: 1. No skull fracture, intracranial hemorrhage, cervical spine fracture or subluxation. 2. Minimal diffuse cerebral atrophy. 3. Cervical spine degenerative changes. 4. Mild bilateral carotid artery atheromatous calcifications.   Electronically Signed   By: Claudie Revering M.D.   On: 09/26/2014 13:51   Dg Chest Port 1 View  09/26/2014   CLINICAL DATA:  [place] approximately 15 feet from a Bobcat.  EXAM: PORTABLE CHEST - 1 VIEW  COMPARISON:  None.  FINDINGS: Borderline enlarged cardiac silhouette. Clear lungs. No fracture or pneumothorax seen.  IMPRESSION: No acute abnormality.   Electronically Signed   By: Claudie Revering M.D.   On: 09/26/2014 12:29   Dg Foot Complete Right  09/26/2014   CLINICAL DATA:  [place] 15 feet, loss of consciousness for 30 seconds RIGHT foot pain  EXAM: RIGHT FOOT COMPLETE - 3+ VIEW  COMPARISON:  None  FINDINGS: Osseous mineralization normal.  Joint spaces preserved.  No fracture, dislocation, or bone destruction.  IMPRESSION: Normal exam.   Electronically Signed   By: Lavonia Dana M.D.   On: 09/26/2014 12:34     EKG Interpretation None      MDM   Final diagnoses:  Fall (on)(from) incline, initial encounter  Rib fractures, left, closed, initial encounter   64 year old male with 15 foot fall from bob cat tractor prior to arrival. He did have loss of consciousness for approximately 30 seconds that was witnessed by his son. Patient is currently awake, alert, and appropriately oriented.  Neurologically intact currently.  He has some tenderness of his left lateral and anterior ribs without acute deformity or crepitus. Lungs are clear bilaterally. Abdominal exam is benign.  Portable chest x-ray without  evidence of pneumothorax. CT head, cervical spine, and chest were obtained-- hairline fractures noted of left fourth, fifth, and sixth ribs, again no pneumothorax. Patient's vital signs have remained stable on room air in the ED, he has not required any supplemental oxygen. He is also not required any supplemental pain medication. neurologic exam remains non-focal.  Patient would like to be discharged home which I feels appropriate given his injuries are stable at this time. Ace wraps were applied to patient's chest for comfort.  Incentive spirometer also given and instructed on use here.  Will d/c home with pain meds.  Patient given strict return precautions for any new or worsening symptoms including increased pain, shortness of breath, chest pain. Patient will follow-up with his PCP in the interim.  Discussed plan with patient, he/she acknowledged understanding and agreed with plan of care.  Case discussed with attending physician, Dr. Vanita Panda, who evaluated patient and agrees with assessment and plan of care.  Larene Pickett, PA-C 09/26/14 [place], PA-C 09/26/14 1504  Carmin Muskrat, MD 09/26/14 [phone]

## 2014-09-26 NOTE — ED Notes (Signed)
Patient transported to CT 

## 2014-09-26 NOTE — ED Notes (Signed)
Lab called and stated initial cbc clotted, new order placed.

## 2014-09-26 NOTE — Discharge Instructions (Signed)
Take the prescribed medication as directed. Use caution when taking Percocet if you're planning to drive or operate heavy machinery as medication can make you drowsy. Follow-up with your primary care physician. Return to the ED for new or worsening symptoms-- uncontrolled pain, shortness of breath, chest pain, etc.

## 2014-10-28 ENCOUNTER — Other Ambulatory Visit: Payer: Self-pay | Admitting: Family Medicine

## 2014-10-28 NOTE — Telephone Encounter (Signed)
Medication filled x1 with no refills.   Requires office visit before any further refills can be given.   Letter sent.  

## 2014-11-19 ENCOUNTER — Other Ambulatory Visit: Payer: Self-pay | Admitting: Family Medicine

## 2015-02-03 ENCOUNTER — Other Ambulatory Visit: Payer: Self-pay | Admitting: Family Medicine

## 2015-02-03 MED ORDER — LISINOPRIL 10 MG PO TABS: 10.0000 mg | ORAL_TABLET | Freq: Every day | ORAL | Status: DC

## 2015-02-03 MED ORDER — ATORVASTATIN CALCIUM 20 MG PO TABS: 20.0000 mg | ORAL_TABLET | Freq: Every day | ORAL | Status: DC

## 2015-06-05 ENCOUNTER — Encounter: Payer: Self-pay | Admitting: Family Medicine

## 2015-06-05 ENCOUNTER — Ambulatory Visit (INDEPENDENT_AMBULATORY_CARE_PROVIDER_SITE_OTHER): Payer: Medicare Other | Admitting: Family Medicine

## 2015-06-05 VITALS — BP 130/74 | HR 80 | Temp 98.2°F | Resp 18 | Wt 257.0 lb

## 2015-06-05 DIAGNOSIS — R1011 Right upper quadrant pain: Secondary | ICD-10-CM

## 2015-06-05 LAB — COMPLETE METABOLIC PANEL WITH GFR
ALT: 32 U/L (ref 9–46)
AST: 26 U/L (ref 10–35)
Albumin: 4.5 g/dL (ref 3.6–5.1)
Alkaline Phosphatase: 63 U/L (ref 40–115)
BUN: 19 mg/dL (ref 7–25)
CO2: 26 mmol/L (ref 20–31)
Calcium: 10.1 mg/dL (ref 8.6–10.3)
Chloride: 102 mmol/L (ref 98–110)
Creat: 1.16 mg/dL (ref 0.70–1.25)
GFR, Est African American: 76 mL/min (ref >=60)
GFR, Est Non African American: 66 mL/min (ref >=60)
Glucose, Bld: 88 mg/dL (ref 70–99)
Potassium: 4.4 mmol/L (ref 3.5–5.3)
Sodium: 140 mmol/L (ref 135–146)
Total Bilirubin: 0.5 mg/dL (ref 0.2–1.2)
Total Protein: 7.3 g/dL (ref 6.1–8.1)

## 2015-06-05 LAB — CBC WITH DIFFERENTIAL/PLATELET
Basophils Absolute: 0 10*3/uL (ref 0.0–0.1)
Basophils Relative: 0 % (ref 0–1)
EOS ABS: 0.1 10*3/uL (ref 0.0–0.7)
EOS PCT: 1 % (ref 0–5)
HCT: 46.5 % (ref 39.0–52.0)
Hemoglobin: 16 g/dL (ref 13.0–17.0)
LYMPHS ABS: 2.8 10*3/uL (ref 0.7–4.0)
Lymphocytes Relative: 32 % (ref 12–46)
MCH: 30 pg (ref 26.0–34.0)
MCHC: 34.4 g/dL (ref 30.0–36.0)
MCV: 87.1 fL (ref 78.0–100.0)
MPV: 9.9 fL (ref 8.6–12.4)
Monocytes Absolute: 0.7 10*3/uL (ref 0.1–1.0)
Monocytes Relative: 8 % (ref 3–12)
Neutro Abs: 5.1 10*3/uL (ref 1.7–7.7)
Neutrophils Relative %: 59 % (ref 43–77)
Platelets: 221 10*3/uL (ref 150–400)
RBC: 5.34 MIL/uL (ref 4.22–5.81)
RDW: 13.6 % (ref 11.5–15.5)
WBC: 8.7 10*3/uL (ref 4.0–10.5)

## 2015-06-05 LAB — LIPASE: Lipase: 45 U/L (ref 7–60)

## 2015-06-05 NOTE — Progress Notes (Signed)
   Subjective:    Patient ID: Keith Day, male    DOB: 05/23/1950, 65 y.o.   MRN: [id number]  HPI  Patient reports a one-week history of pain in the right upper quadrant of his abdomen just below his ribs. There is no tenderness to palpation on the body of the ribs themselves. He denies any pleurisy or hemoptysis. The pain comes and goes. There is no specific trigger that makes the pain worse. There is nothing he can do that makes the pain better. When he has the pain it is severe. It does not radiate. It also hurts to lay on the side at night. He denies any specific injury. He denies any fevers or chills. There is no jaundice on exam Past Medical History  Diagnosis Date  . Hypertension   . Diverticulitis   . Colon polyps   . Diabetes mellitus without complication [facility])    Past Surgical History  Procedure Laterality Date  . Appendectomy    . Fracture surgery     Current Outpatient Prescriptions on File Prior to Visit  Medication Sig Dispense Refill  . atorvastatin (LIPITOR) 20 MG tablet Take 1 tablet (20 mg total) by mouth daily. 90 tablet 3  . ibuprofen (ADVIL,MOTRIN) 600 MG tablet Take 1 tablet (600 mg total) by mouth every 6 (six) hours as needed. 30 tablet 0  . lisinopril (PRINIVIL,ZESTRIL) 10 MG tablet Take 1 tablet (10 mg total) by mouth daily. 90 tablet 3   No current facility-administered medications on file prior to visit.   No Known Allergies Social History   Social History  . Marital Status: Married    Spouse Name: N/A  . Number of Children: N/A  . Years of Education: N/A   Occupational History  . Not on file.   Social History Main Topics  . Smoking status: Former Smoker    Quit date: 02/21/1998  . Smokeless tobacco: Never Used  . Alcohol Use: Yes  . Drug Use: No  . Sexual Activity: Not on file   Other Topics Concern  . Not on file   Social History Narrative     Review of Systems  All other systems reviewed and are negative.      Objective:   Physical Exam  Constitutional: He appears well-developed and well-nourished.  Cardiovascular: Normal rate, regular rhythm and normal heart sounds.   Pulmonary/Chest: Effort normal and breath sounds normal. No respiratory distress. He has no wheezes. He has no rales.  Abdominal: Soft. Bowel sounds are normal. He exhibits no distension and no mass. There is tenderness. There is no rebound and no guarding.  Vitals reviewed.         Assessment & Plan:  RUQ abdominal pain - Plan: CBC with Differential/Platelet, COMPLETE METABOLIC PANEL WITH GFR, Lipase, US Abdomen Limited RUQ  I'm concerned about biliary colic and cholelithiasis. Proceed with a right upper quadrant ultrasound to evaluate for gallstones. I will also check a CBC, CMP, and lipase.Marland Kitchen

## 2015-06-06 ENCOUNTER — Telehealth: Payer: Self-pay | Admitting: *Deleted

## 2015-06-06 NOTE — Telephone Encounter (Signed)
Pt has appt scheduled at [facility] on March 21 at 11:30am, pt is to arrive at 11:10am, pt is to have nothing to eat or drink after midnight night before. LMTRC to pt for appt information.

## 2015-06-09 NOTE — Telephone Encounter (Signed)
LMTRC

## 2015-06-10 ENCOUNTER — Ambulatory Visit
Admission: RE | Admit: 2015-06-10 | Discharge: 2015-06-10 | Disposition: A | Discharge status: Home / Self Care | Payer: Medicare Other | Source: Ambulatory Visit | Attending: Family Medicine | Admitting: Family Medicine

## 2015-06-10 DIAGNOSIS — R1011 Right upper quadrant pain: Secondary | ICD-10-CM

## 2015-06-10 NOTE — Telephone Encounter (Signed)
Pt aware of appt.

## 2015-09-16 ENCOUNTER — Encounter (HOSPITAL_COMMUNITY): Payer: Self-pay | Admitting: Emergency Medicine

## 2015-09-16 ENCOUNTER — Ambulatory Visit (HOSPITAL_COMMUNITY)
Admission: EM | Admit: 2015-09-16 | Discharge: 2015-09-16 | Disposition: A | Discharge status: Home / Self Care | Payer: Medicare Other | Attending: Family Medicine | Admitting: Family Medicine

## 2015-09-16 DIAGNOSIS — Z801 Family history of malignant neoplasm of trachea, bronchus and lung: Secondary | ICD-10-CM | POA: Diagnosis not present

## 2015-09-16 DIAGNOSIS — Z9889 Other specified postprocedural states: Secondary | ICD-10-CM | POA: Diagnosis not present

## 2015-09-16 DIAGNOSIS — I10 Essential (primary) hypertension: Secondary | ICD-10-CM | POA: Insufficient documentation

## 2015-09-16 DIAGNOSIS — Z87891 Personal history of nicotine dependence: Secondary | ICD-10-CM | POA: Insufficient documentation

## 2015-09-16 DIAGNOSIS — J300 Vasomotor rhinitis: Secondary | ICD-10-CM | POA: Insufficient documentation

## 2015-09-16 DIAGNOSIS — Z79899 Other long term (current) drug therapy: Secondary | ICD-10-CM | POA: Diagnosis not present

## 2015-09-16 DIAGNOSIS — Z86010 Personal history of colonic polyps: Secondary | ICD-10-CM | POA: Insufficient documentation

## 2015-09-16 DIAGNOSIS — E119 Type 2 diabetes mellitus without complications: Secondary | ICD-10-CM | POA: Insufficient documentation

## 2015-09-16 LAB — POCT RAPID STREP A: STREPTOCOCCUS, GROUP A SCREEN (DIRECT): NEGATIVE

## 2015-09-16 MED ORDER — TRIAMCINOLONE ACETONIDE 40 MG/ML IJ SUSP
40.0000 mg | Freq: Once | INTRAMUSCULAR | Status: AC
  Administered 2015-09-16: 40 mg via INTRAMUSCULAR

## 2015-09-16 MED ORDER — IPRATROPIUM BROMIDE 0.06 % NA SOLN: 2.0000 | Freq: Four times a day (QID) | NASAL | Status: DC

## 2015-09-16 MED ORDER — TRIAMCINOLONE ACETONIDE 40 MG/ML IJ SUSP
INTRAMUSCULAR | Status: AC
  Filled 2015-09-16: qty 1

## 2015-09-16 NOTE — Discharge Instructions (Signed)
Use bacitracin ointment on eyes as needed. Avoid a/c and fans blowing in face.

## 2015-09-16 NOTE — ED Provider Notes (Signed)
CSN: [id number]     Arrival date & time 09/16/15  1625 History   First MD Initiated Contact with Patient 09/16/15 1719     Chief Complaint  Patient presents with  . Sore Throat  . Cough   (Consider location/radiation/quality/duration/timing/severity/associated sxs/prior Treatment) Patient is a 65 y.o. male presenting with pharyngitis. The history is provided by the patient.  Sore Throat This is a new problem. The current episode started more than 2 days ago. The problem has been gradually worsening.    Past Medical History  Diagnosis Date  . Hypertension   . Diverticulitis   . Colon polyps   . Diabetes mellitus without complication [facility])    Past Surgical History  Procedure Laterality Date  . Appendectomy    . Fracture surgery     Family History  Problem Relation Age of Onset  . Cancer Father 63    Lung Cancer   Social History  Substance Use Topics  . Smoking status: Former Smoker    Quit date: 02/21/1998  . Smokeless tobacco: Never Used  . Alcohol Use: Yes    Review of Systems  Constitutional: Negative.  Negative for fever and chills.  HENT: Positive for congestion, postnasal drip and sore throat.   Respiratory: Negative.   Cardiovascular: Negative.   All other systems reviewed and are negative.   Allergies  Review of patient's allergies indicates no known allergies.  Home Medications   Prior to Admission medications   Medication Sig Start Date End Date Taking? Authorizing Provider  atorvastatin (LIPITOR) 20 MG tablet Take 1 tablet (20 mg total) by mouth daily. 02/03/15  Yes Susy Frizzle, MD  lisinopril (PRINIVIL,ZESTRIL) 10 MG tablet Take 1 tablet (10 mg total) by mouth daily. 02/03/15  Yes Susy Frizzle, MD  ibuprofen (ADVIL,MOTRIN) 600 MG tablet Take 1 tablet (600 mg total) by mouth every 6 (six) hours as needed. 09/26/14   Larene Pickett, PA-C  ipratropium (ATROVENT) 0.06 % nasal spray Place 2 sprays into both nostrils 4 (four) times daily. 09/16/15    Billy Fischer, MD   Meds Ordered and Administered this Visit   Medications  triamcinolone acetonide (KENALOG-40) injection 40 mg (not administered)    BP 151/82 mmHg  Pulse 86  Temp(Src) 98.9 F (37.2 C) (Oral)  Resp 18  SpO2 96% No data found.   Physical Exam  Constitutional: He is oriented to person, place, and time. He appears well-developed and well-nourished.  HENT:  Right Ear: External ear normal.  Left Ear: External ear normal.  Mouth/Throat: Oropharynx is clear and moist.  Eyes: Conjunctivae are normal. Pupils are equal, round, and reactive to light.  Neck: Normal range of motion. Neck supple.  Cardiovascular: Normal rate and normal heart sounds.   Pulmonary/Chest: Breath sounds normal.  Lymphadenopathy:    He has no cervical adenopathy.  Neurological: He is alert and oriented to person, place, and time.  Skin: Skin is warm and dry.  Nursing note and vitals reviewed.   ED Course  Procedures (including critical care time)  Labs Review Labs Reviewed  POCT RAPID STREP A    Imaging Review No results found.   Visual Acuity Review  Right Eye Distance:   Left Eye Distance:   Bilateral Distance:    Right Eye Near:   Left Eye Near:    Bilateral Near:         MDM   1. Vasomotor rhinitis        Billy Fischer, MD 09/16/15  1756 

## 2015-09-16 NOTE — ED Notes (Signed)
Pt has been suffering from a sore throat and cough for about 3 days.  Pt denies any fever or any other concerning symptoms.

## 2015-09-17 ENCOUNTER — Ambulatory Visit: Payer: Medicare Other | Admitting: Physician Assistant

## 2015-09-19 LAB — CULTURE, GROUP A STREP (THRC)

## 2015-11-09 ENCOUNTER — Other Ambulatory Visit: Payer: Self-pay | Admitting: Family Medicine

## 2015-11-10 NOTE — Telephone Encounter (Signed)
Refill appropriate and filled per protocol. 

## 2016-02-02 ENCOUNTER — Ambulatory Visit (INDEPENDENT_AMBULATORY_CARE_PROVIDER_SITE_OTHER): Payer: Medicare Other | Admitting: Family Medicine

## 2016-02-02 DIAGNOSIS — Z23 Encounter for immunization: Secondary | ICD-10-CM

## 2016-02-11 ENCOUNTER — Other Ambulatory Visit: Payer: Self-pay

## 2016-02-11 MED ORDER — LISINOPRIL 10 MG PO TABS: 10.0000 mg | ORAL_TABLET | Freq: Every day | ORAL | 0 refills | Status: DC

## 2016-02-11 MED ORDER — ATORVASTATIN CALCIUM 20 MG PO TABS: 20.0000 mg | ORAL_TABLET | Freq: Every day | ORAL | 0 refills | Status: DC

## 2016-02-11 NOTE — Telephone Encounter (Signed)
Rx filled per protocol  

## 2016-03-21 DIAGNOSIS — L600 Ingrowing nail: Secondary | ICD-10-CM | POA: Diagnosis not present

## 2016-05-11 ENCOUNTER — Other Ambulatory Visit: Payer: Self-pay

## 2016-05-11 MED ORDER — ATORVASTATIN CALCIUM 20 MG PO TABS: 20.0000 mg | ORAL_TABLET | Freq: Every day | ORAL | 0 refills | Status: DC

## 2016-05-11 MED ORDER — LISINOPRIL 10 MG PO TABS: 10.0000 mg | ORAL_TABLET | Freq: Every day | ORAL | 0 refills | Status: DC

## 2016-05-11 NOTE — Telephone Encounter (Signed)
Refill appropriate 

## 2016-10-04 ENCOUNTER — Other Ambulatory Visit: Payer: Self-pay | Admitting: Physician Assistant

## 2016-10-04 NOTE — Telephone Encounter (Signed)
Refill appropriate 

## 2016-12-10 IMAGING — CT CT HEAD W/O CM
3 of 5 series · 16 of 47 positions shown, 19 images · non-contrast
Comparison: None.

CLINICAL DATA: Fell approximately 15 feet from a large backhoe.
Laceration above the left eye. Left zygoma abrasion and bruising.
Probable loss of consciousness.

EXAM:
CT HEAD WITHOUT CONTRAST
CT CERVICAL SPINE WITHOUT CONTRAST
TECHNIQUE: Multidetector CT imaging of the head and cervical spine was
performed following the standard protocol without intravenous
contrast. Multiplanar CT image reconstructions of the cervical spine
were also generated.

[Series 602: sagittal · sagittal · 0.44mm/px · 3 of 46 slices shown]
[im 16/46  brain]
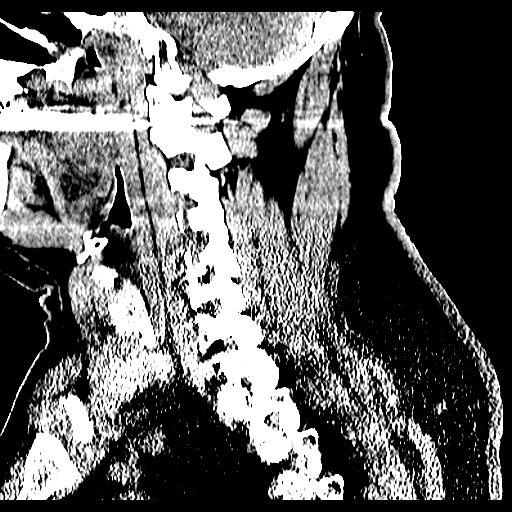
[im 23/46  brain]
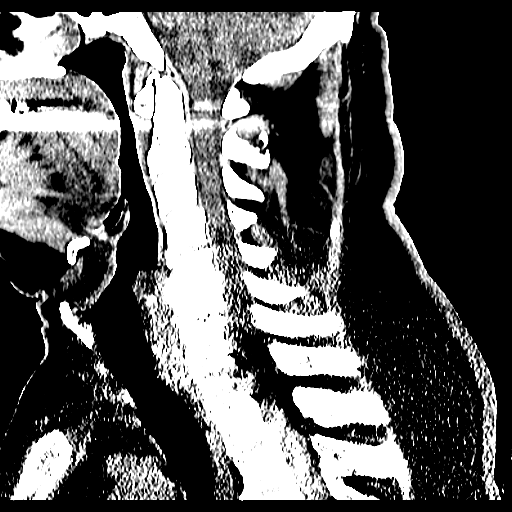
[im 31/46  brain]
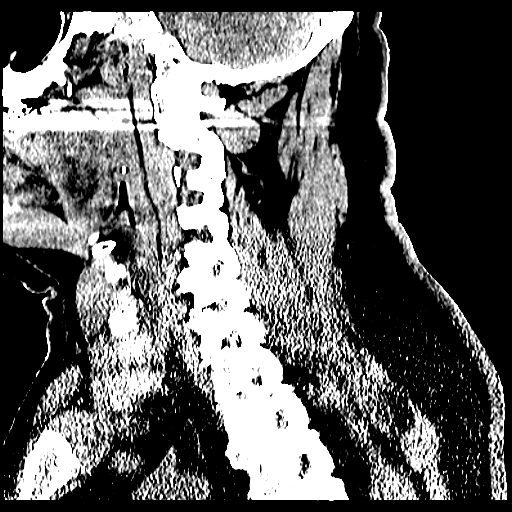

[Series 603: coronals · coronal · 0.44mm/px · 3 of 46 slices shown]
[im 16/46  brain]
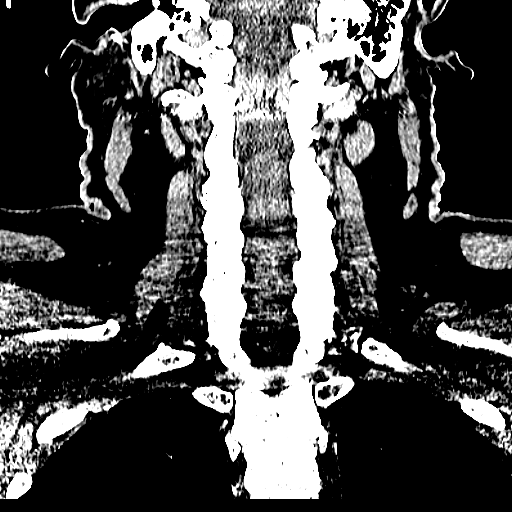
[im 21/46  brain]
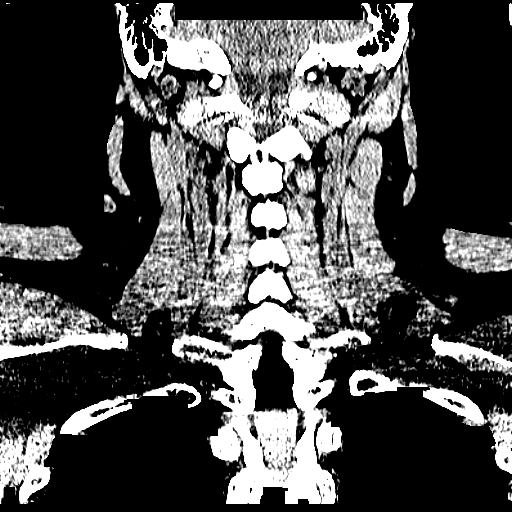
[im 26/46  brain]
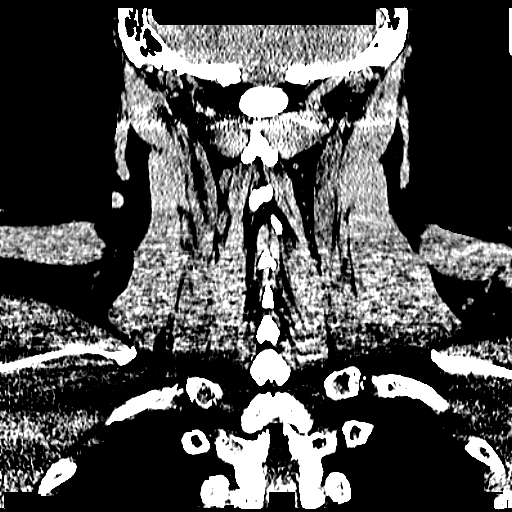

[Series 604: orthogonals · axial · 0.44mm/px · z∈[+1331,+1496]mm · 10 of 104 slices shown, 13 images]
[im 9/104  brain]
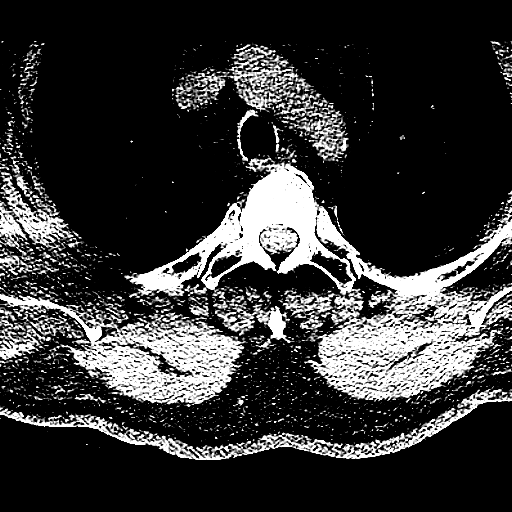
[im 9/104  bone]
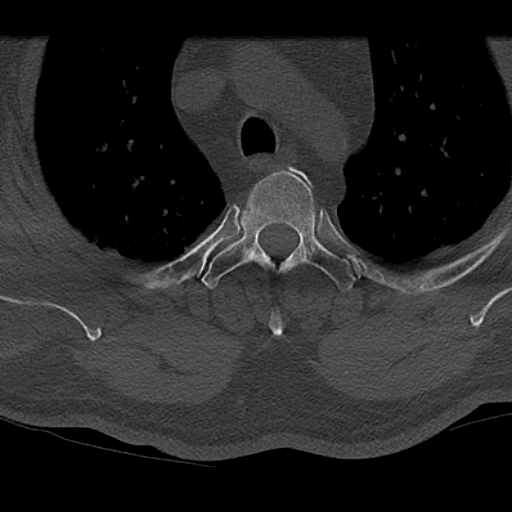
[im 18/104  brain]
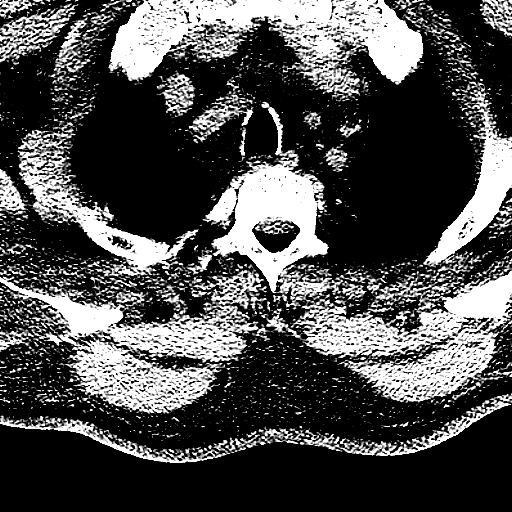
[im 26/104  brain]
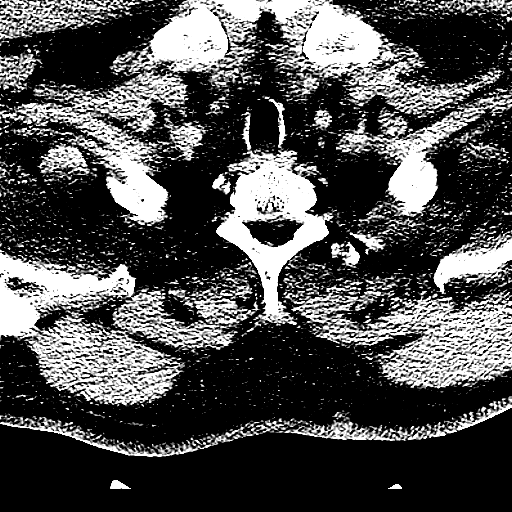
[im 35/104  brain]
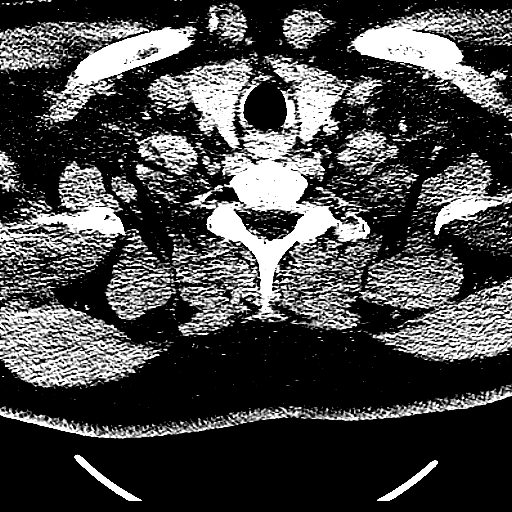
[im 43/104  brain]
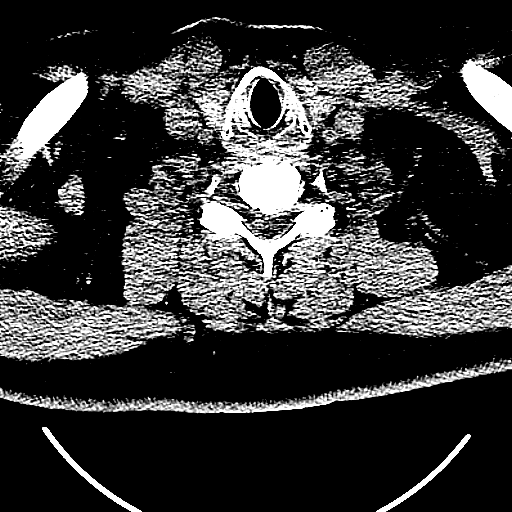
[im 43/104  bone]
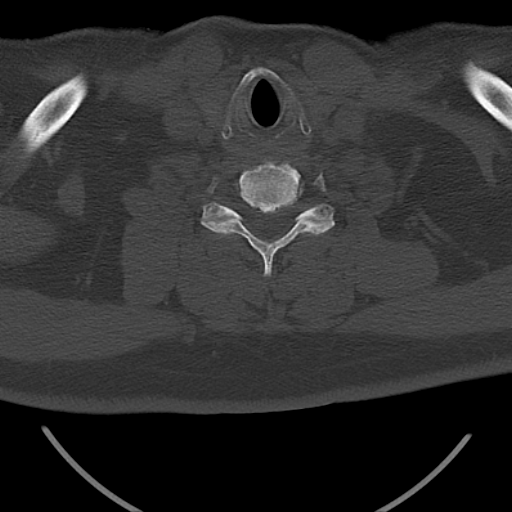
[im 61/104  brain]
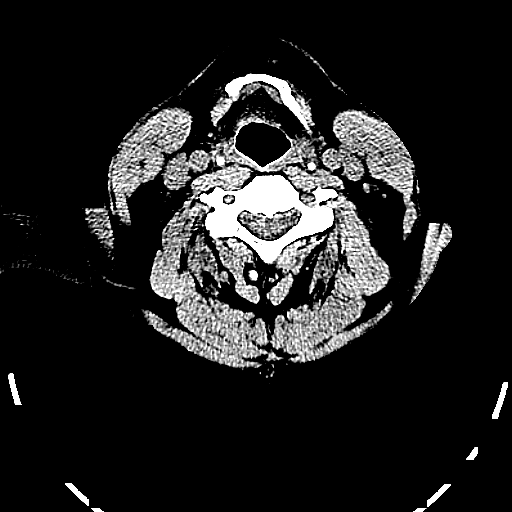
[im 69/104  brain]
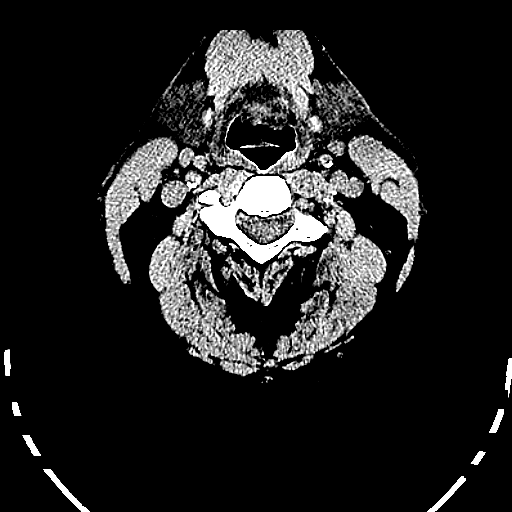
[im 78/104  brain]
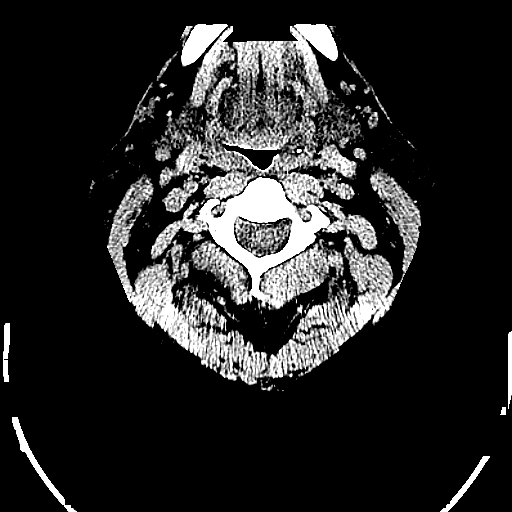
[im 86/104  brain]
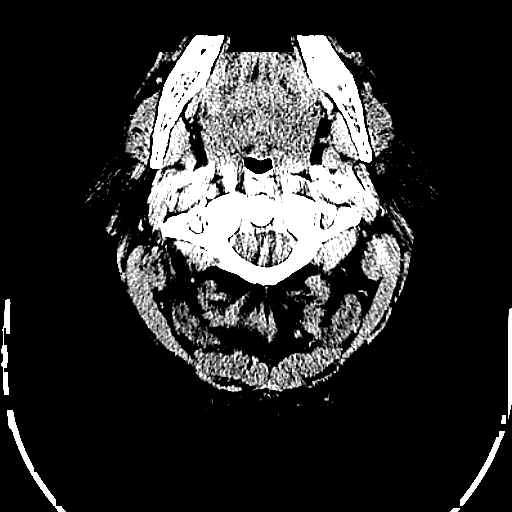
[im 86/104  bone]
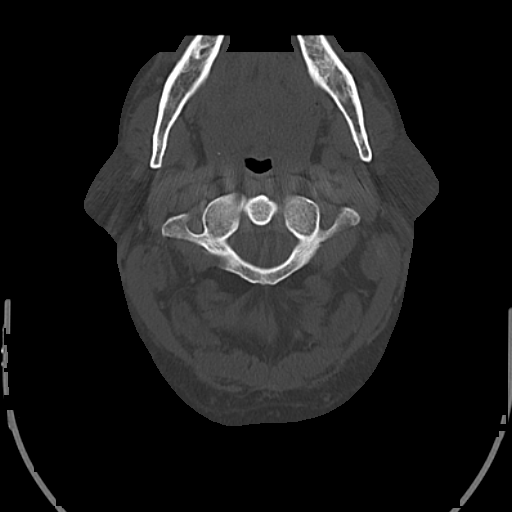
[im 95/104  brain]
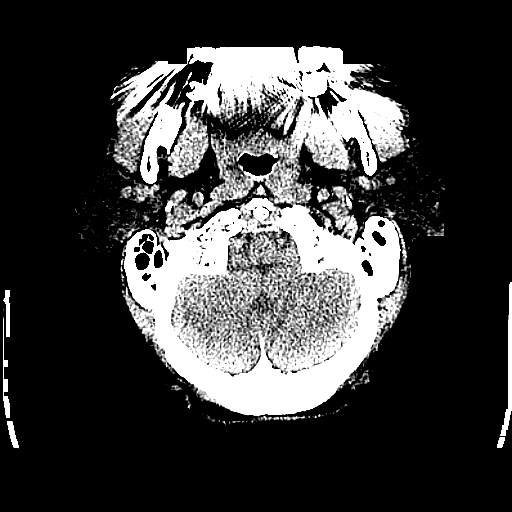

[16 of 47 positions shown; findings below may reference images not displayed]

FINDINGS: CT HEAD FINDINGS

Minimally enlarged ventricles and subarachnoid spaces. No skull
fracture, intracranial hemorrhage or paranasal sinus air-fluid
levels.

CT CERVICAL SPINE FINDINGS

Mid cervical spine and upper thoracic spine degenerative changes. No
prevertebral soft tissue swelling, fractures or subluxations. Mild
bilateral carotid artery calcifications.
IMPRESSION: 1. No skull fracture, intracranial hemorrhage, cervical spine
fracture or subluxation.
2. Minimal diffuse cerebral atrophy.
3. Cervical spine degenerative changes.
4. Mild bilateral carotid artery atheromatous calcifications.

## 2017-02-23 ENCOUNTER — Other Ambulatory Visit: Payer: Self-pay | Admitting: Physician Assistant

## 2017-03-04 ENCOUNTER — Telehealth: Payer: Self-pay | Admitting: Physician Assistant

## 2017-03-04 NOTE — Telephone Encounter (Signed)
Patient called requesting refill of his lisinopril and lipitor. Rx was sent to mail order on 02/23/2017 , however patient stated he never received the medication. I called [facility] to see what happened and spoke with Elmyra Ricks. She informed me that the medications were never sent out and she could not give a reason as to why. Elmyra Ricks also informed me that [facility] has requested a transfer of his medications on12/14.  I called [facility] to have them change rx to 30 supply because patient is due for an office visit. They informed me that the patient had already picked the rx up for 90 day supply. Patient is due for office visit and no more refills until office visit with lab work. Patient is aware that he is due for an office visit explained that to him when he originally called about his rx

## 2017-03-18 ENCOUNTER — Other Ambulatory Visit: Payer: Self-pay

## 2017-03-18 ENCOUNTER — Ambulatory Visit (INDEPENDENT_AMBULATORY_CARE_PROVIDER_SITE_OTHER): Payer: Medicare Other | Admitting: *Deleted

## 2017-03-18 DIAGNOSIS — Z23 Encounter for immunization: Secondary | ICD-10-CM | POA: Diagnosis not present

## 2017-05-19 ENCOUNTER — Ambulatory Visit: Payer: Medicare Other

## 2017-07-13 ENCOUNTER — Ambulatory Visit (INDEPENDENT_AMBULATORY_CARE_PROVIDER_SITE_OTHER): Payer: Medicare Other | Admitting: Physician Assistant

## 2017-07-13 ENCOUNTER — Other Ambulatory Visit: Payer: Self-pay

## 2017-07-13 ENCOUNTER — Encounter: Payer: Self-pay | Admitting: Physician Assistant

## 2017-07-13 VITALS — BP 126/82 | HR 65 | Temp 97.5°F | Resp 18 | Ht 71.0 in | Wt 269.4 lb

## 2017-07-13 DIAGNOSIS — E785 Hyperlipidemia, unspecified: Secondary | ICD-10-CM | POA: Diagnosis not present

## 2017-07-13 DIAGNOSIS — I10 Essential (primary) hypertension: Secondary | ICD-10-CM | POA: Diagnosis not present

## 2017-07-13 DIAGNOSIS — J301 Allergic rhinitis due to pollen: Secondary | ICD-10-CM

## 2017-07-13 DIAGNOSIS — J309 Allergic rhinitis, unspecified: Secondary | ICD-10-CM | POA: Insufficient documentation

## 2017-07-13 DIAGNOSIS — R635 Abnormal weight gain: Secondary | ICD-10-CM

## 2017-07-13 LAB — COMPLETE METABOLIC PANEL WITH GFR
AG Ratio: 1.7 (calc) (ref 1.0–2.5)
ALBUMIN MSPROF: 4.3 g/dL (ref 3.6–5.1)
ALT: 29 U/L (ref 9–46)
AST: 27 U/L (ref 10–35)
Alkaline phosphatase (APISO): 65 U/L (ref 40–115)
BUN: 16 mg/dL (ref 7–25)
CALCIUM: 9.4 mg/dL (ref 8.6–10.3)
CO2: 26 mmol/L (ref 20–32)
CREATININE: 1.23 mg/dL (ref 0.70–1.25)
Chloride: 105 mmol/L (ref 98–110)
GFR, EST AFRICAN AMERICAN: 70 mL/min/{1.73_m2} (ref >=60)
GFR, EST NON AFRICAN AMERICAN: 60 mL/min/{1.73_m2} (ref >=60)
Globulin: 2.6 g/dL (calc) (ref 1.9–3.7)
Glucose, Bld: 102 mg/dL — ABNORMAL HIGH (ref 65–99)
Potassium: 4.7 mmol/L (ref 3.5–5.3)
Sodium: 140 mmol/L (ref 135–146)
TOTAL PROTEIN: 6.9 g/dL (ref 6.1–8.1)
Total Bilirubin: 0.7 mg/dL (ref 0.2–1.2)

## 2017-07-13 LAB — LIPID PANEL
CHOL/HDL RATIO: 3 (calc) (ref <5.0)
Cholesterol: 136 mg/dL (ref <200)
HDL: 45 mg/dL (ref >40)
LDL CHOLESTEROL (CALC): 70 mg/dL
NON-HDL CHOLESTEROL (CALC): 91 mg/dL (ref <130)
TRIGLYCERIDES: 128 mg/dL (ref <150)

## 2017-07-13 LAB — TSH: TSH: 0.98 m[IU]/L (ref 0.40–4.50)

## 2017-07-13 LAB — EXTRA LAV TOP TUBE

## 2017-07-13 MED ORDER — CETIRIZINE HCL 10 MG PO CAPS: ORAL_CAPSULE | ORAL | 3 refills | Status: DC

## 2017-07-13 NOTE — Progress Notes (Signed)
Patient ID: AVERY KLINGBEIL MRN: [id number], DOB: Feb 13, 1951, 67 y.o. Date of Encounter: @DATE @  Chief Complaint:  Chief Complaint  Patient presents with  . routine follow up    HPI: 67 y.o. year old male  presents with above.   Is here for routine follow-up visit for hypertension and hyperlipidemia.  Reports that he is taking the Lipitor daily.  Having no myalgias or other adverse effects. Ports he is taking the lisinopril daily.  No lightheadedness or other adverse effects.  Does have a couple of concerns to address as well.  Is that he has been having some [place] cough recently.  Says that he is having a lot of clear rhinorrhea and drainage down his throat new over the last couple weeks and this is when the cough started as well.  Says that he is having weight gain and does not know if it is "because he is getting older ".  Says that he thinks he needs something to help with sleep.  I then discussed his sleep cycle etc. 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  Falls asleep on the couch/recliner around 7:30 PM.  Sleeps until around 1 or 2 AM but then wakes up until around 3 - 4 am.  Is that he ends up getting up and watching TV during that time.  Says that it seems like his body has gotten used to this schedule/on this schedule.  No other concerns to address today.   Past Medical History:  Diagnosis Date  . Colon polyps   . Diabetes mellitus without complication ([place])   . Diverticulitis   . Hypertension      Home Meds: Outpatient Medications Prior to Visit  Medication Sig Dispense Refill  . atorvastatin (LIPITOR) 20 MG tablet TAKE 1 TABLET BY MOUTH  DAILY 90 tablet 1  . lisinopril (PRINIVIL,ZESTRIL) 10 MG tablet TAKE 1 TABLET BY MOUTH  DAILY 90 tablet 1  . ibuprofen (ADVIL,MOTRIN) 600 MG tablet Take 1 tablet (600 mg total) by mouth every 6 (six) hours as needed. 30 tablet 0  . ipratropium (ATROVENT) 0.06 % nasal spray Place 2 sprays into both  nostrils 4 (four) times daily. 15 mL 1   No facility-administered medications prior to visit.     Allergies: No Known Allergies  Social History   Socioeconomic History  . Marital status: Married    Spouse name: Not on file  . Number of children: Not on file  . Years of education: Not on file  . Highest education level: Not on file  Occupational History  . Not on file  Social Needs  . Financial resource strain: Not on file  . Food insecurity:    Worry: Not on file    Inability: Not on file  . Transportation needs:    Medical: Not on file    Non-medical: Not on file  Tobacco Use  . Smoking status: Former Smoker    Last attempt to quit: 02/21/1998    Years since quitting: 19.4  . Smokeless tobacco: Never Used  Substance and Sexual Activity  . Alcohol use: Yes  . Drug use: No  . Sexual activity: Not on file  Lifestyle  . Physical activity:    Days per week: Not on file    Minutes per session: Not on file  . Stress: Not on file  Relationships  . Social connections:    Talks on phone: Not on file    Gets together:  Not on file    Attends religious service: Not on file    Active member of club or organization: Not on file    Attends meetings of clubs or organizations: Not on file    Relationship status: Not on file  . Intimate partner violence:    Fear of current or ex partner: Not on file    Emotionally abused: Not on file    Physically abused: Not on file    Forced sexual activity: Not on file  Other Topics Concern  . Not on file  Social History Narrative  . Not on file    Family History  Problem Relation Age of Onset  . Cancer Father 24       Lung Cancer     Review of Systems:  See HPI for pertinent ROS. All other ROS negative.    Physical Exam: Blood pressure 126/82, pulse 65, temperature (!) 97.5 F (36.4 C), temperature source Oral, resp. rate 18, height 5\' 11"  (1.803 m), weight 122.2 kg (269 lb 6.4 oz), SpO2 96 %., Body mass index is 37.57  kg/m. General: Obese WM. Appears in no acute distress. Head: Normocephalic, atraumatic, eyes without discharge, sclera non-icteric, nares are without discharge. Bilateral auditory canals clear, TM's are without perforation, pearly grey and translucent with reflective cone of light bilaterally. Oral cavity moist, posterior pharynx without exudate, erythema.  Neck: Supple. No thyromegaly. No lymphadenopathy.  No carotid bruits. Lungs: Clear bilaterally to auscultation without wheezes, rales, or rhonchi. Breathing is unlabored. Heart: RRR with S1 S2. No murmurs, rubs, or gallops. Abdomen: Soft, non-tender, non-distended with normoactive bowel sounds. No hepatomegaly. No rebound/guarding. No obvious abdominal masses. Musculoskeletal:  Strength and tone normal for age. Extremities/Skin: Warm and dry.  Neuro: Alert and oriented X 3. Moves all extremities spontaneously. Gait is normal. CNII-XII grossly in tact. Psych:  Responds to questions appropriately with a normal affect.     ASSESSMENT AND PLAN:  67 y.o. year old male with   1. Hyperlipidemia, unspecified hyperlipidemia type Is on Lipitor.  He is fasting.  Check lab to monitor. - COMPLETE METABOLIC PANEL WITH GFR - Lipid panel  2. Essential hypertension Blood pressure is at goal.  On lisinopril.  Check lab to monitor. - COMPLETE METABOLIC PANEL WITH GFR  3. Seasonal allergic rhinitis due to pollen To take Zyrtec daily as needed during allergy symptoms seasons. - Cetirizine HCl (ZYRTEC ALLERGY) 10 MG CAPS; Take 1 daily as needed for allergy symptoms  Dispense: 30 capsule; Refill: 3  4. Weight gain Will check TSH.  Discussed that if this is abnormal will manage accordingly.  Otherwise that this lab is normal then weight management comes down to diet and exercise.  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 - TSH  Regarding his sleep issues----Discussed that he is  going to have to get up and go walk outside or do physical activities in the house to keep himself awake longer at night before going to sleep.  Discussed that it is not a normal expectation to think that he is going to fall asleep at 7:30 PM and sleep all the way until 7 AM.  Is going to force have to force himself to stay awake until at least 9 PM.  Also discussed that he needs to be coming in for routine visits and labs every 6 months. Need to update preventive care at future visits if he will come in  more routinely.    Signed, [address] [place], [place], [facility] 07/13/2017 8:20 AM

## 2017-08-02 ENCOUNTER — Other Ambulatory Visit: Payer: Self-pay

## 2017-08-02 MED ORDER — LISINOPRIL 10 MG PO TABS: 10.0000 mg | ORAL_TABLET | Freq: Every day | ORAL | 1 refills | Status: DC

## 2017-08-02 MED ORDER — ATORVASTATIN CALCIUM 20 MG PO TABS: 20.0000 mg | ORAL_TABLET | Freq: Every day | ORAL | 1 refills | Status: DC

## 2017-08-24 IMAGING — US US ABDOMEN LIMITED
1 series · 14 of 25 positions shown · non-contrast
Comparison: None.

CLINICAL DATA: Right upper quadrant abdominal pain for 2 weeks

EXAM:
US ABDOMEN LIMITED - RIGHT UPPER QUADRANT

[Series 1: us abdomen limited · 0.35mm/px · 14 of 41 slices shown]
[im 1/41]
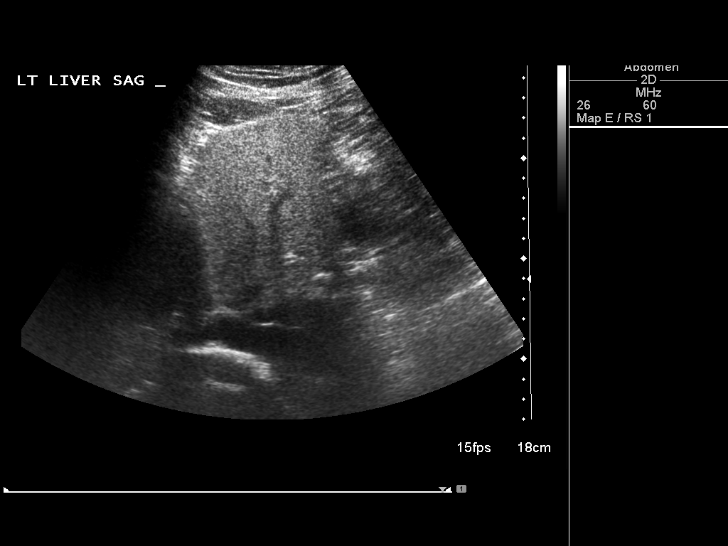
[im 4/41]
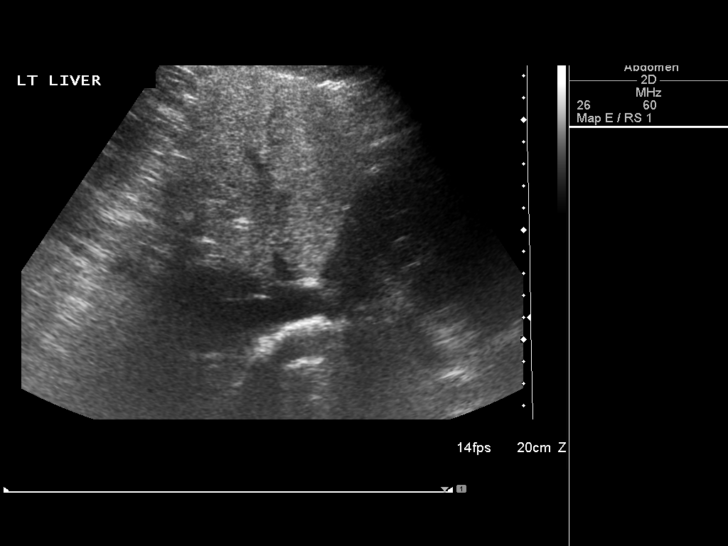
[im 7/41]
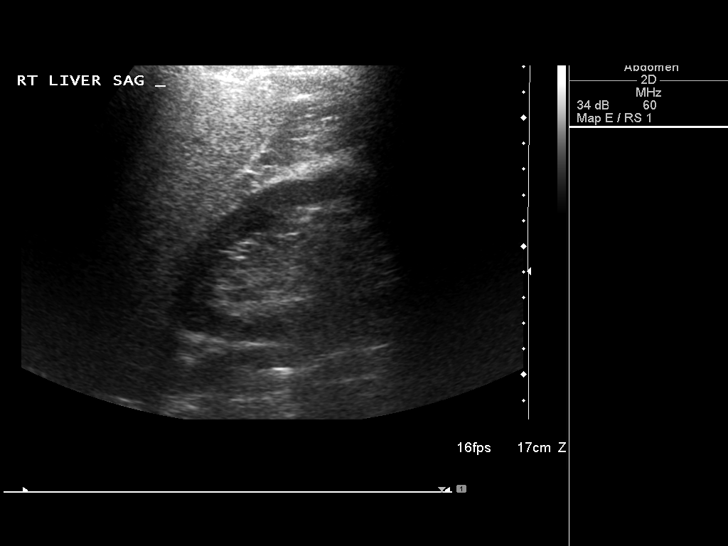
[im 11/41]
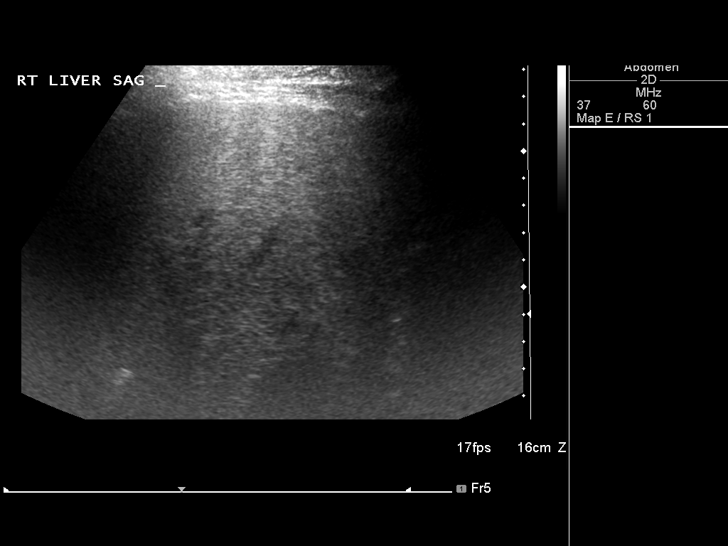
[im 14/41]
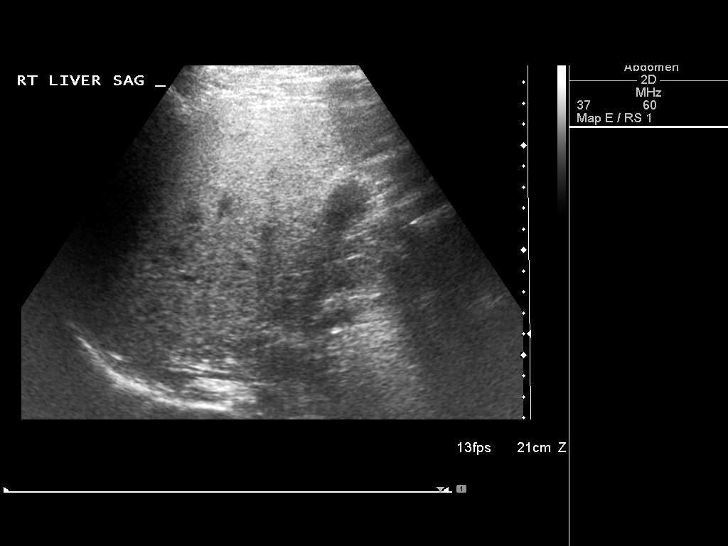
[im 16/41]
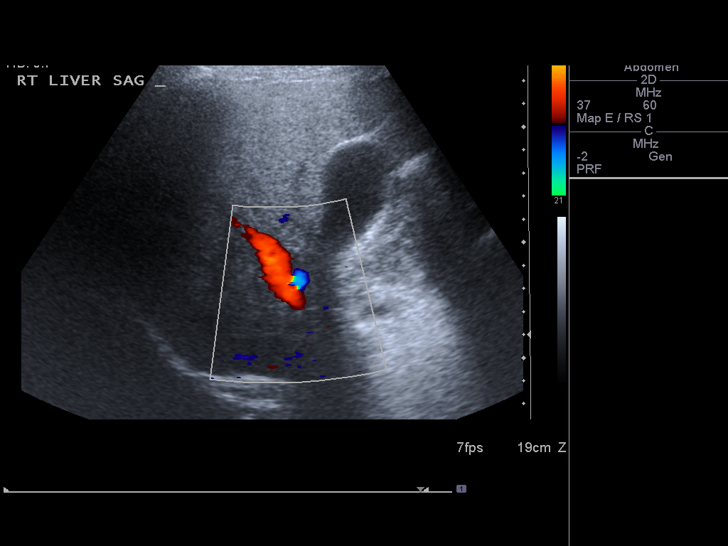
[im 19/41]
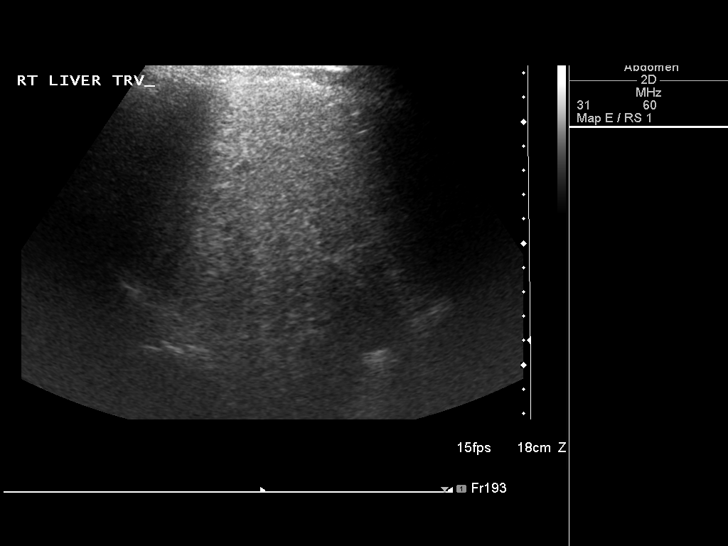
[im 22/41]
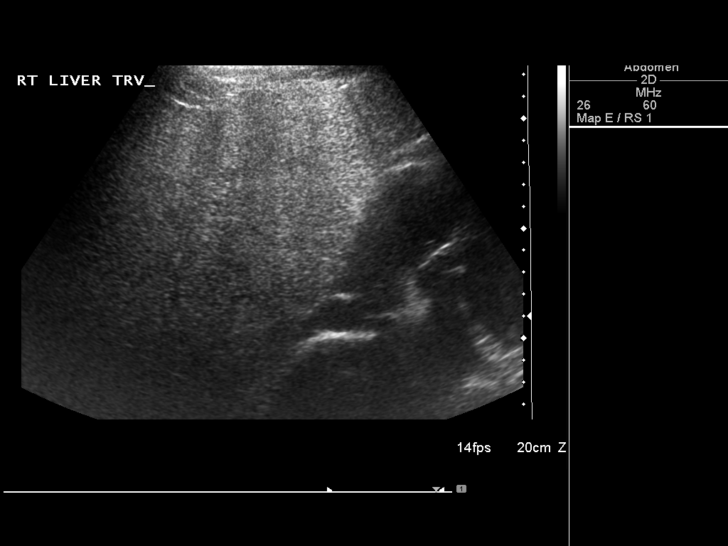
[im 26/41]
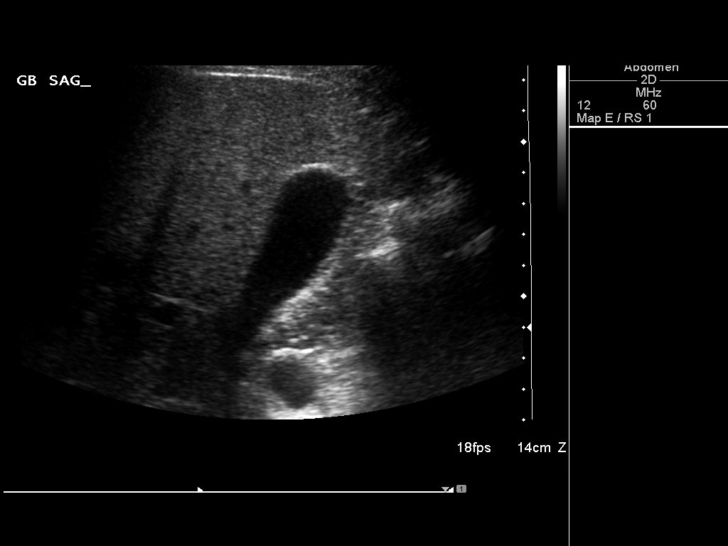
[im 27/41]
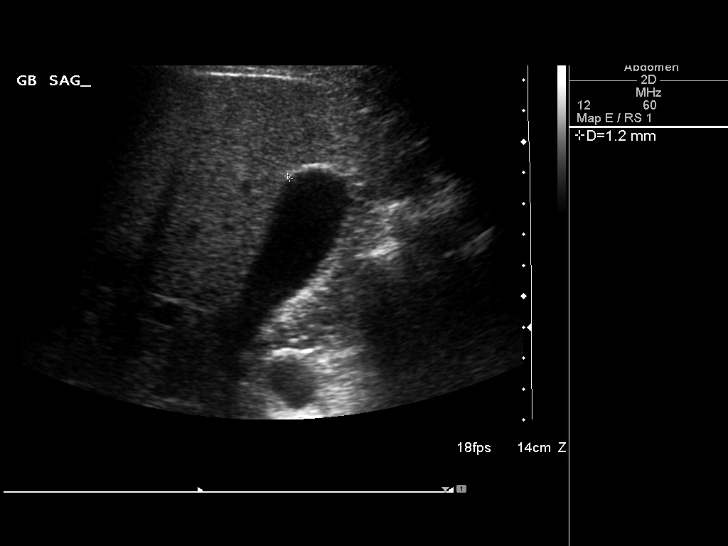
[im 31/41]
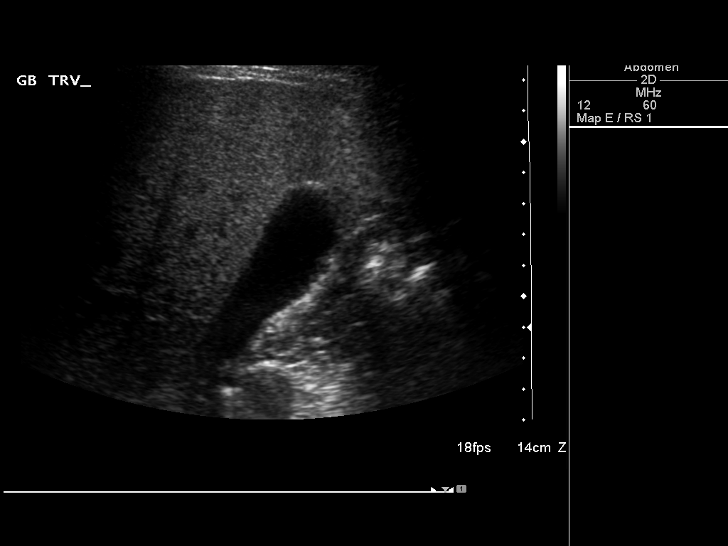
[im 34/41]
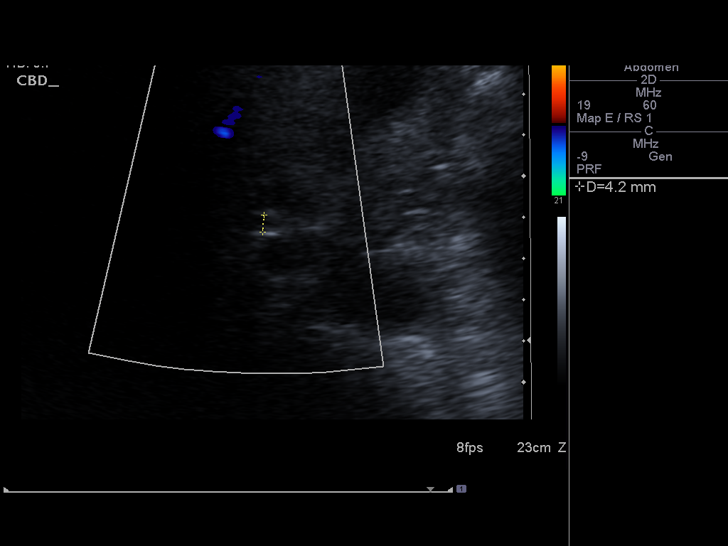
[im 37/41]
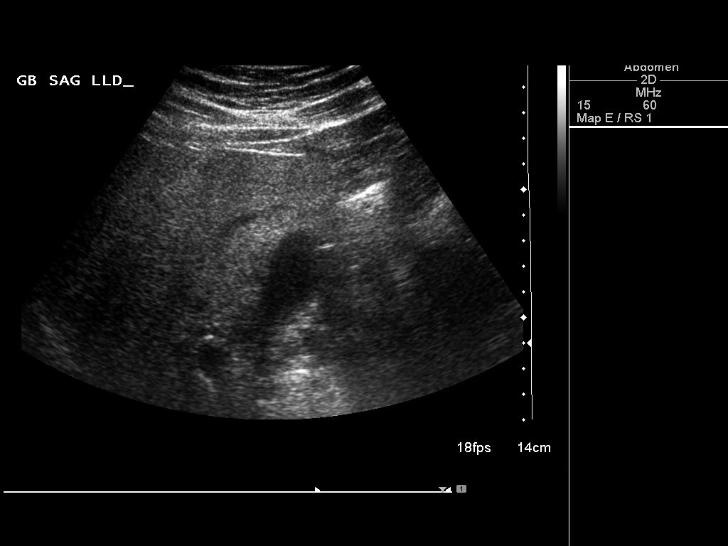
[im 41/41]
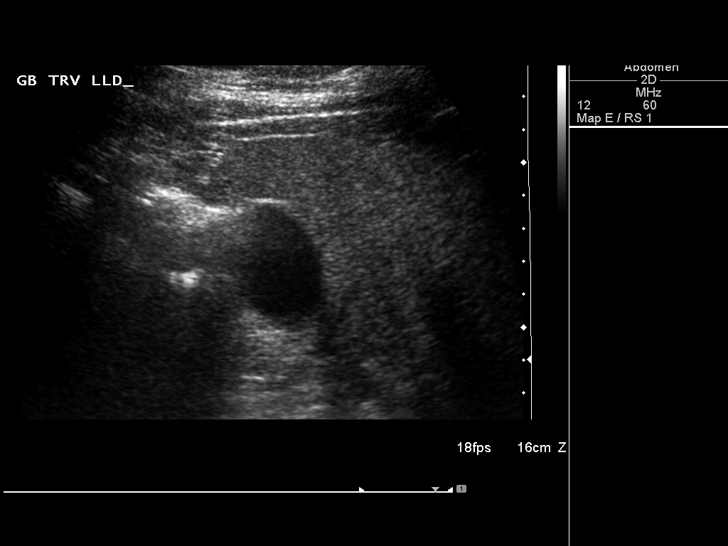

[14 of 25 positions shown; findings below may reference images not displayed]

FINDINGS: Gallbladder:

The gallbladder is visualized and no gallstones are noted. There is
no pain over the gallbladder with compression.

Common bile duct:

Diameter: The common bile duct measures 4.2 mm in diameter.

Liver:

The liver is echogenic and inhomogeneous consistent with fatty
infiltration diffusely. No focal hepatic abnormality is seen.
IMPRESSION: 1. Suspect diffuse fatty infiltration of the liver. No focal
abnormality.
2. No gallstones.

## 2017-12-17 ENCOUNTER — Other Ambulatory Visit: Payer: Self-pay | Admitting: Physician Assistant

## 2018-02-13 ENCOUNTER — Other Ambulatory Visit: Payer: Self-pay

## 2018-02-13 NOTE — Patient Outreach (Signed)
[facility]) Care Management  02/13/2018  OIVA DIBARI 09-23-1950 [id number]   Medication Adherence call to Mr. Landis Gandy patient did not answer patient is due on Lisinopril 10 mg and Atorvastatin 20 mg. Mr. Shampine is showing past due under [place].   [place] Management Direct Dial [phone]  Fax [phone] [email or web address]

## 2018-03-12 DIAGNOSIS — J069 Acute upper respiratory infection, unspecified: Secondary | ICD-10-CM | POA: Diagnosis not present

## 2018-04-19 ENCOUNTER — Telehealth: Payer: Self-pay | Admitting: Family Medicine

## 2018-04-19 NOTE — Telephone Encounter (Signed)
Pt has not seen Dr. Dennard Schaumann in over 2 years and I do not see anything about it in OV notes. Please call and schedule apt if he wants a referral. Thanks

## 2018-04-19 NOTE — Telephone Encounter (Signed)
pts states he has seen pickard multipled times for a bump and a mole on his back and that we said we would refer him if needed to a dermatologist.   Pt would like to go ahead and get referral placed. He would like to go to [facility] dermatology

## 2018-04-21 ENCOUNTER — Ambulatory Visit (INDEPENDENT_AMBULATORY_CARE_PROVIDER_SITE_OTHER): Payer: Medicare Other | Admitting: Family Medicine

## 2018-04-21 ENCOUNTER — Encounter: Payer: Self-pay | Admitting: Family Medicine

## 2018-04-21 VITALS — BP 160/70 | HR 80 | Temp 98.2°F | Resp 18 | Ht 71.0 in | Wt 266.0 lb

## 2018-04-21 DIAGNOSIS — L980 Pyogenic granuloma: Secondary | ICD-10-CM

## 2018-04-21 DIAGNOSIS — E785 Hyperlipidemia, unspecified: Secondary | ICD-10-CM | POA: Diagnosis not present

## 2018-04-21 DIAGNOSIS — Z125 Encounter for screening for malignant neoplasm of prostate: Secondary | ICD-10-CM

## 2018-04-21 DIAGNOSIS — L821 Other seborrheic keratosis: Secondary | ICD-10-CM

## 2018-04-21 DIAGNOSIS — Z23 Encounter for immunization: Secondary | ICD-10-CM

## 2018-04-21 DIAGNOSIS — I10 Essential (primary) hypertension: Secondary | ICD-10-CM

## 2018-04-21 NOTE — Addendum Note (Signed)
Addended by: Shary Decamp B on: 04/21/2018 02:42 PM   Modules accepted: Orders

## 2018-04-21 NOTE — Progress Notes (Signed)
Subjective:    Patient ID: Keith Day, male    DOB: Sep 11, 1950, 67 y.o.   MRN: [id number]  HPI Patient has not been seen in more than 2 years.  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  He is long overdue for fasting lab work to monitor his cholesterol.  He is overdue for prostate cancer screening with PSA.  He is also overdue for colonoscopy.  He states that his last colonoscopy was more than 10 years ago.  However the primary reason he made an appointment today is for referral to dermatology.  There has been a rapidly growing lesion on his right helix growing on the pinna.  It is approximately 4 mm in diameter.  It is a pedunculated polyp which is friable and rapidly bleeds.  He is try to remove it with a pair of clippers and this may to grow back faster.  It has the appearance of a pyogenic granuloma although I cannot rule out underlying malignancy.  He also has a 1 cm Adee warty lesion growing on his back that itches.  It appears to be a seborrheic keratosis which is benign.  He would like to see a dermatologist to have these removed and the one on his ear biopsy. Past Medical History:  Diagnosis Date  . Colon polyps   . Diabetes mellitus without complication ([place])   . Diverticulitis   . Hypertension    Past Surgical History:  Procedure Laterality Date  . APPENDECTOMY    . FRACTURE SURGERY     Current Outpatient Medications on File Prior to Visit  Medication Sig Dispense Refill  . atorvastatin (LIPITOR) 20 MG tablet TAKE 1 TABLET BY MOUTH  DAILY 90 tablet 1  . lisinopril (PRINIVIL,ZESTRIL) 10 MG tablet TAKE 1 TABLET BY MOUTH  DAILY 90 tablet 1   No current facility-administered medications on file prior to visit.    No Known Allergies Social History   Socioeconomic History  . Marital status: Married    Spouse name: Not on file  . Number  of children: Not on file  . Years of education: Not on file  . Highest education level: Not on file  Occupational History  . Not on file  Social Needs  . Financial resource strain: Not on file  . Food insecurity:    Worry: Not on file    Inability: Not on file  . Transportation needs:    Medical: Not on file    Non-medical: Not on file  Tobacco Use  . Smoking status: Former Smoker    Last attempt to quit: 02/21/1998    Years since quitting: 20.1  . Smokeless tobacco: Never Used  Substance and Sexual Activity  . Alcohol use: Yes  . Drug use: No  . Sexual activity: Not on file  Lifestyle  . Physical activity:    Days per week: Not on file    Minutes per session: Not on file  . Stress: Not on file  Relationships  . Social connections:    Talks on phone: Not on file    Gets together: Not on file    Attends religious service: Not on file    Active member of club or organization: Not on file    Attends meetings of clubs or organizations: Not on file    Relationship status: Not on file  .  Intimate partner violence:    Fear of current or ex partner: Not on file    Emotionally abused: Not on file    Physically abused: Not on file    Forced sexual activity: Not on file  Other Topics Concern  . Not on file  Social History Narrative  . Not on file      Review of Systems  All other systems reviewed and are negative.      Objective:   Physical Exam Vitals signs reviewed.  Constitutional:      Appearance: Normal appearance. He is normal weight.  HENT:     Ears:   Cardiovascular:     Rate and Rhythm: Normal rate and regular rhythm.     Pulses: Normal pulses.     Heart sounds: Normal heart sounds. No murmur. No friction rub. No gallop.   Pulmonary:     Effort: Pulmonary effort is normal. No respiratory distress.     Breath sounds: Normal breath sounds. No stridor. No wheezing or rhonchi.  Neurological:     Mental Status: He is alert.           Assessment &  Plan:  Pyogenic granuloma - Plan: Ambulatory referral to Dermatology  Seborrheic keratoses  Essential hypertension  Hyperlipidemia, unspecified hyperlipidemia type  Prostate cancer screening  I am concerned about the patient's blood pressure but he assures me that his blood pressure is better at home.  I recommended that he check it more frequently and notify us if is greater than 140/90.  I will refer the patient to a dermatologist to remove the pyogenic granuloma from his right pinna.  I do believe it needs a biopsy to rule out underlying malignancy but my suspicion is a pyogenic granuloma.  They can also remove the seborrheic keratosis from his back.  I want him to return fasting so that we can check his cholesterol as well as his CBC and CMP.  He is overdue for prostate cancer screening so we will check a PSA.  I will schedule the patient for Cologuard for colon cancer screening.

## 2018-04-27 ENCOUNTER — Telehealth: Payer: Self-pay | Admitting: *Deleted

## 2018-04-27 NOTE — Telephone Encounter (Signed)
Received verbal orders for Cologuard.   Order placed via [facility].   Cologuard (Order [id number])

## 2018-07-15 DIAGNOSIS — L820 Inflamed seborrheic keratosis: Secondary | ICD-10-CM | POA: Diagnosis not present

## 2018-07-15 DIAGNOSIS — D489 Neoplasm of uncertain behavior, unspecified: Secondary | ICD-10-CM | POA: Diagnosis not present

## 2018-07-15 DIAGNOSIS — L821 Other seborrheic keratosis: Secondary | ICD-10-CM | POA: Diagnosis not present

## 2018-07-15 DIAGNOSIS — D1801 Hemangioma of skin and subcutaneous tissue: Secondary | ICD-10-CM | POA: Diagnosis not present

## 2018-07-18 ENCOUNTER — Other Ambulatory Visit: Payer: Self-pay | Admitting: *Deleted

## 2018-07-18 MED ORDER — ATORVASTATIN CALCIUM 20 MG PO TABS: 20.0000 mg | ORAL_TABLET | Freq: Every day | ORAL | 1 refills | Status: DC

## 2018-07-18 MED ORDER — LISINOPRIL 10 MG PO TABS: 10.0000 mg | ORAL_TABLET | Freq: Every day | ORAL | 1 refills | Status: DC

## 2018-07-19 DIAGNOSIS — B078 Other viral warts: Secondary | ICD-10-CM | POA: Diagnosis not present

## 2018-07-19 DIAGNOSIS — D489 Neoplasm of uncertain behavior, unspecified: Secondary | ICD-10-CM | POA: Diagnosis not present

## 2018-08-17 ENCOUNTER — Other Ambulatory Visit: Payer: Self-pay

## 2018-08-17 NOTE — Patient Outreach (Signed)
[facility]) Care Management  08/17/2018  Keith Day 1950/05/28 [id number]   Medication Adherence call to Mr. Keith Day patient answer but hung up. Keith Day is showing past due on Lisinopril 10 mg and Atorvastatin 20 mg under [place].   [place] Management Direct Dial [phone]  Fax [phone] [email or web address]

## 2018-08-23 DIAGNOSIS — D239 Other benign neoplasm of skin, unspecified: Secondary | ICD-10-CM | POA: Diagnosis not present

## 2018-08-23 DIAGNOSIS — L820 Inflamed seborrheic keratosis: Secondary | ICD-10-CM | POA: Diagnosis not present

## 2018-08-23 DIAGNOSIS — B078 Other viral warts: Secondary | ICD-10-CM | POA: Diagnosis not present

## 2019-01-05 ENCOUNTER — Other Ambulatory Visit: Payer: Self-pay | Admitting: Family Medicine

## 2019-03-30 DIAGNOSIS — S0501XA Injury of conjunctiva and corneal abrasion without foreign body, right eye, initial encounter: Secondary | ICD-10-CM | POA: Diagnosis not present

## 2019-03-30 DIAGNOSIS — H5711 Ocular pain, right eye: Secondary | ICD-10-CM | POA: Diagnosis not present

## 2019-04-23 ENCOUNTER — Other Ambulatory Visit: Payer: Self-pay | Admitting: Family Medicine

## 2019-12-05 ENCOUNTER — Other Ambulatory Visit: Payer: Self-pay

## 2019-12-05 ENCOUNTER — Ambulatory Visit
Admission: EM | Admit: 2019-12-05 | Discharge: 2019-12-05 | Disposition: A | Discharge status: Home / Self Care | Payer: Medicare Other | Attending: Emergency Medicine | Admitting: Emergency Medicine

## 2019-12-05 DIAGNOSIS — Z1152 Encounter for screening for COVID-19: Secondary | ICD-10-CM | POA: Diagnosis not present

## 2019-12-05 DIAGNOSIS — R52 Pain, unspecified: Secondary | ICD-10-CM

## 2019-12-05 DIAGNOSIS — M7918 Myalgia, other site: Secondary | ICD-10-CM

## 2019-12-05 LAB — POCT INFLUENZA A/B
Influenza A, POC: NEGATIVE
Influenza B, POC: NEGATIVE

## 2019-12-05 MED ORDER — DEXAMETHASONE SODIUM PHOSPHATE 10 MG/ML IJ SOLN
10.0000 mg | Freq: Once | INTRAMUSCULAR | Status: AC
  Administered 2019-12-05: 10 mg via INTRAMUSCULAR

## 2019-12-05 NOTE — ED Triage Notes (Signed)
Pt presents with body aches and headache that began about 4 days ago

## 2019-12-05 NOTE — ED Provider Notes (Signed)
[facility]   [id number] 12/05/19 Arrival Time: 2202   CC: COVID symptoms  SUBJECTIVE: History from: patient.  Keith Day is a 69 y.o. male who presents to the urgent care for complaint of headache, body aches  for the past 4 days.  Denies sick exposure to COVID, flu or strep.  Denies recent travel.  Has tried OTC medication without relief.  Denies aggravating factors.  Denies previous symptoms in the past.   Denies fever, chills, fatigue, sinus pain, rhinorrhea, sore throat, SOB, wheezing, chest pain, nausea, changes in bowel or bladder habits.     ROS: As per HPI.  All other pertinent ROS negative.     Past Medical History:  Diagnosis Date  . Colon polyps   . Diabetes mellitus without complication ([place])   . Diverticulitis   . Hypertension    Past Surgical History:  Procedure Laterality Date  . APPENDECTOMY    . FRACTURE SURGERY     No Known Allergies No current facility-administered medications on file prior to encounter.   Current Outpatient Medications on File Prior to Encounter  Medication Sig Dispense Refill  . atorvastatin (LIPITOR) 20 MG tablet TAKE 1 TABLET BY MOUTH  DAILY 90 tablet 3  . lisinopril (ZESTRIL) 10 MG tablet TAKE 1 TABLET BY MOUTH  DAILY 90 tablet 3   Social History   Socioeconomic History  . Marital status: Married    Spouse name: Not on file  . Number of children: Not on file  . Years of education: Not on file  . Highest education level: Not on file  Occupational History  . Not on file  Tobacco Use  . Smoking status: Former Smoker    Quit date: 02/21/1998    Years since quitting: 21.8  . Smokeless tobacco: Never Used  Substance and Sexual Activity  . Alcohol use: Yes  . Drug use: No  . Sexual activity: Not on file  Other Topics Concern  . Not on file  Social History Narrative  . Not on file   Social Determinants of Health   Financial Resource Strain:   . Difficulty of Paying Living Expenses: Not on file  Food  Insecurity:   . Worried About Charity fundraiser in the Last Year: Not on file  . Ran Out of Food in the Last Year: Not on file  Transportation Needs:   . Lack of Transportation (Medical): Not on file  . Lack of Transportation (Non-Medical): Not on file  Physical Activity:   . Days of Exercise per Week: Not on file  . Minutes of Exercise per Session: Not on file  Stress:   . Feeling of Stress : Not on file  Social Connections:   . Frequency of Communication with Friends and Family: Not on file  . Frequency of Social Gatherings with Friends and Family: Not on file  . Attends Religious Services: Not on file  . Active Member of Clubs or Organizations: Not on file  . Attends Archivist Meetings: Not on file  . Marital Status: Not on file  Intimate Partner Violence:   . Fear of Current or Ex-Partner: Not on file  . Emotionally Abused: Not on file  . Physically Abused: Not on file  . Sexually Abused: Not on file   Family History  Problem Relation Age of Onset  . Cancer Father 41       Lung Cancer    OBJECTIVE:  Vitals:   12/05/19 0937  BP: (!) 143/81  Pulse: 97  Resp: 20  Temp: 99.2 F (37.3 C)  SpO2: 93%     General appearance: alert; appears fatigued, but nontoxic; speaking in full sentences and tolerating own secretions HEENT: NCAT; Ears: EACs clear, TMs pearly gray; Eyes: PERRL.  EOM grossly intact. Sinuses: nontender; Nose: nares patent without rhinorrhea, Throat: oropharynx clear, tonsils non erythematous or enlarged, uvula midline  Neck: supple without LAD Lungs: unlabored respirations, symmetrical air entry; cough: mild; no respiratory distress; CTAB Heart: regular rate and rhythm.  Radial pulses 2+ symmetrical bilaterally Skin: warm and dry Psychological: alert and cooperative; normal mood and affect  LABS:  Results for orders placed or performed during the hospital encounter of 12/05/19 (from the past 24 hour(s))  POCT Influenza A/B     Status: None    Collection Time: 12/05/19 10:00 AM  Result Value Ref Range   Influenza A, POC Negative Negative   Influenza B, POC Negative Negative     ASSESSMENT & PLAN:  1. Encounter for screening for COVID-19   2. Generalized body aches     No orders of the defined types were placed in this encounter.   Discharge instructions  COVID testing ordered.  It will take between 2-7 days for test results.  Someone will contact you regarding abnormal results.    In the meantime: You should remain isolated in your home for 10 days from symptom onset AND greater than 24 hours after symptoms resolution (absence of fever without the use of fever-reducing medication and improvement in respiratory symptoms), whichever is longer Get plenty of rest and push fluids Use medications daily for symptom relief Use OTC medications like ibuprofen or tylenol as needed fever or pain Call or go to the ED if you have any new or worsening symptoms such as fever, worsening cough, shortness of breath, chest tightness, chest pain, turning blue, changes in mental status, etc...   Reviewed expectations re: course of current medical issues. Questions answered. Outlined signs and symptoms indicating need for more acute intervention. Patient verbalized understanding. After Visit Summary given.      Note: This document was prepared using Dragon voice recognition software and may include unintentional dictation errors.    Emerson Monte, FNP 12/05/19 1012

## 2019-12-05 NOTE — Discharge Instructions (Addendum)
POCT influenza test was negative  COVID testing ordered.  It will take between 2-7 days for test results.  Someone will contact you regarding abnormal results.    In the meantime: You should remain isolated in your home for 10 days from symptom onset AND greater than 24 hours after symptoms resolution (absence of fever without the use of fever-reducing medication and improvement in respiratory symptoms), whichever is longer Get plenty of rest and push fluids Use medications daily for symptom relief Use OTC medications like ibuprofen or tylenol as needed fever or pain Call or go to the ED if you have any new or worsening symptoms such as fever, worsening cough, shortness of breath, chest tightness, chest pain, turning blue, changes in mental status, etc..Marland Kitchen

## 2019-12-07 LAB — NOVEL CORONAVIRUS, NAA: SARS-CoV-2, NAA: NOT DETECTED

## 2019-12-07 LAB — SARS-COV-2, NAA 2 DAY TAT

## 2020-03-05 ENCOUNTER — Other Ambulatory Visit: Payer: Self-pay | Admitting: Family Medicine

## 2020-03-05 DIAGNOSIS — Z20822 Contact with and (suspected) exposure to covid-19: Secondary | ICD-10-CM | POA: Diagnosis not present

## 2020-03-05 DIAGNOSIS — Z03818 Encounter for observation for suspected exposure to other biological agents ruled out: Secondary | ICD-10-CM | POA: Diagnosis not present

## 2020-04-11 ENCOUNTER — Ambulatory Visit (INDEPENDENT_AMBULATORY_CARE_PROVIDER_SITE_OTHER): Payer: Medicare Other | Admitting: Family Medicine

## 2020-04-11 ENCOUNTER — Other Ambulatory Visit: Payer: Self-pay

## 2020-04-11 ENCOUNTER — Encounter: Payer: Self-pay | Admitting: Family Medicine

## 2020-04-11 VITALS — BP 122/74 | HR 90 | Temp 98.6°F | Resp 14 | Ht 71.0 in | Wt 278.0 lb

## 2020-04-11 DIAGNOSIS — Z23 Encounter for immunization: Secondary | ICD-10-CM | POA: Diagnosis not present

## 2020-04-11 DIAGNOSIS — R0681 Apnea, not elsewhere classified: Secondary | ICD-10-CM

## 2020-04-11 NOTE — Progress Notes (Signed)
Subjective:    Patient ID: Keith Day, male    DOB: 28-Apr-1950, 70 y.o.   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  The doctor asked him if he snored.  The patient was truthful and stated that he did snore.  As a result the physician would not pass him for his CDL physical until he has a sleep study.  He states that he snores every night.  He does feel extremely tired when he wakes up in the morning.  He does not feel that he gets restorative sleep.  He denies any witnessed apneic episodes.  He denies any chest pain or shortness of breath.  He denies any early morning headaches.  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  He denies any symptoms of restless leg syndrome. Past Surgical History:  Procedure Laterality Date   APPENDECTOMY     FRACTURE SURGERY     Past Medical History:  Diagnosis Date   Colon polyps    Diabetes mellitus without complication ([place])    Diverticulitis    Hypertension    Current Outpatient Medications on File Prior to Visit  Medication Sig Dispense Refill   atorvastatin (LIPITOR) 20 MG tablet TAKE 1 TABLET BY MOUTH  DAILY 30 tablet 0   lisinopril (ZESTRIL) 10 MG tablet TAKE 1 TABLET BY MOUTH  DAILY 30 tablet 0   No current facility-administered medications on file prior to visit.   No Known Allergies Social History   Socioeconomic History   Marital status: Married    Spouse name: Not on file   Number of children: Not on file   Years of education: Not on file   Highest education level: Not on file  Occupational History   Not on file  Tobacco Use   Smoking status: Former Smoker    Quit date: 02/21/1998    Years since quitting: 22.1   Smokeless tobacco: Never Used  Substance and Sexual Activity   Alcohol use: Yes   Drug use: No   Sexual activity: Not on file  Other Topics Concern   Not on file  Social History Narrative   Not on file    Social Determinants of Health   Financial Resource Strain: Not on file  Food Insecurity: Not on file  Transportation Needs: Not on file  Physical Activity: Not on file  Stress: Not on file  Social Connections: Not on file  Intimate Partner Violence: Not on file      Review of Systems  All other systems reviewed and are negative.      Objective:   Physical Exam Vitals reviewed.  Constitutional:      Appearance: He is obese.  Cardiovascular:     Rate and Rhythm: Normal rate and regular rhythm.     Heart sounds: Normal heart sounds.  Pulmonary:     Effort: Pulmonary effort is normal.     Breath sounds: Normal breath sounds.  Neurological:     General: No focal deficit present.     Mental Status: He is alert and oriented to person, place, and time. Mental status is at baseline.           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  Therefore I will  consult a sleep specialist as his CDL license is dependent upon having a sleep study and if sleep apnea is present being treated appropriately.  Patient received his flu shot today

## 2020-04-18 ENCOUNTER — Other Ambulatory Visit: Payer: Self-pay | Admitting: Family Medicine

## 2020-05-12 ENCOUNTER — Ambulatory Visit: Payer: Medicare Other | Admitting: Neurology

## 2020-05-12 ENCOUNTER — Encounter: Payer: Self-pay | Admitting: Neurology

## 2020-05-12 VITALS — BP 132/76 | HR 89 | Ht 71.0 in | Wt 278.0 lb

## 2020-05-12 DIAGNOSIS — R0683 Snoring: Secondary | ICD-10-CM

## 2020-05-12 DIAGNOSIS — G479 Sleep disorder, unspecified: Secondary | ICD-10-CM

## 2020-05-12 DIAGNOSIS — E669 Obesity, unspecified: Secondary | ICD-10-CM

## 2020-05-12 DIAGNOSIS — R351 Nocturia: Secondary | ICD-10-CM

## 2020-05-12 DIAGNOSIS — Z9189 Other specified personal risk factors, not elsewhere classified: Secondary | ICD-10-CM

## 2020-05-12 NOTE — Patient Instructions (Signed)

## 2020-05-12 NOTE — Progress Notes (Addendum)
Subjective:    Patient ID: Keith Day is a 70 y.o. male.  HPI     Star Age, MD, PhD [facility] [address] [place], [place] [address]  Dear Dr. Dennard Schaumann,  I saw your patient, Keith Day, upon your kind request in my sleep clinic today for initial consultation of his sleep disorder, in particular, concern for underlying obstructive sleep apnea.  The patient is unaccompanied.  As you know, Keith Day (name corrected on 06/27/2020) is a 69 year old right-handed gentleman with an underlying medical history of diabetes, hypertension, diverticulitis, and obesity, who reports snoring and some difficulty maintaining sleep.  He has no difficulty going to sleep.  Snoring is reported by his wife, he denies any morning headaches but does have nocturia about once per average night.  He does not have a very set schedule.  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 xcessive daytime somnolence.  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  I reviewed your office note from 04/11/2020.  His Epworth sleepiness score is 9 out of 24, fatigue severity score is 24 out of 63.  Generally he is in bed between 9 and 10 PM and rise time is around 3 AM but he goes to the dining and watches TV, generally dozes off again and does not have a set get up time.  He lives with his wife.  He has no obvious family history of sleep apnea.  He quit smoking some 25 years ago and drinks alcohol in the form of 1 shot or 1 beer in the evenings.  He has gained weight over time, slowly over the years but more significantly so in the past couple years.  He has a TV in the bedroom and watches it at night, the try to turn it off in the middle of the night.  He drinks caffeine in the form of coffee, 1 cup in the morning and 1 soda per day on average, no tea.  His Past  Medical History Is Significant For: Past Medical History:  Diagnosis Date  . Colon polyps   . Diabetes mellitus without complication ([place])   . Diverticulitis   . Hypertension     His Past Surgical History Is Significant For: Past Surgical History:  Procedure Laterality Date  . APPENDECTOMY    . FRACTURE SURGERY      His Family History Is Significant For: Family History  Problem Relation Age of Onset  . Cancer Father 63       Lung Cancer    His Social History Is Significant For: Social History   Socioeconomic History  . Marital status: Married    Spouse name: Not on file  . Number of children: Not on file  . Years of education: Not on file  . Highest education level: Not on file  Occupational History  . Not on file  Tobacco Use  . Smoking status: Former Smoker    Quit date: 02/21/1998    Years since quitting: 22.2  . Smokeless tobacco: Never Used  Substance and Sexual Activity  . Alcohol use: Yes  . Drug use: No  . Sexual activity: Not on file  Other Topics Concern  . Not on file  Social History Narrative  . Not on file   Social Determinants  of Health   Financial Resource Strain: Not on file  Food Insecurity: Not on file  Transportation Needs: Not on file  Physical Activity: Not on file  Stress: Not on file  Social Connections: Not on file    His Allergies Are:  No Known Allergies:   His Current Medications Are:  Outpatient Encounter Medications as of 05/12/2020  Medication Sig  . atorvastatin (LIPITOR) 20 MG tablet TAKE 1 TABLET BY MOUTH  DAILY  . lisinopril (ZESTRIL) 10 MG tablet TAKE 1 TABLET BY MOUTH  DAILY   No facility-administered encounter medications on file as of 05/12/2020.  :  Review of Systems:  Out of a complete 14 point review of systems, all are reviewed and negative with the exception of these symptoms as listed below: Review of Systems  Neurological:       Here for sleep consult as recommended by DOT doctor. Pt has CDL licenses   Epworth Sleepiness Scale 0= would never doze 1= slight chance of dozing 2= moderate chance of dozing 3= high chance of dozing  Sitting and reading:2 Watching TV:3 Sitting inactive in a public place (ex. Theater or meeting):1 As a passenger in a car for an hour without a break:2 Lying down to rest in the afternoon:0 Sitting and talking to someone:0 Sitting quietly after lunch (no alcohol):1 In a car, while stopped in traffic:2 Total:9     Objective:  Neurological Exam  Physical Exam Physical Examination:   Vitals:   05/12/20 0913  BP: 132/76  Pulse: 89  SpO2: 95%    General Examination: The patient is a very pleasant 70 y.o. male in no acute distress. He appears well-developed and well-nourished and well groomed.   HEENT: Normocephalic, atraumatic, pupils are equal, round and reactive to light, extraocular tracking is good without limitation to gaze excursion or nystagmus noted. Hearing is grossly intact. Face is symmetric with normal facial animation. Speech is clear with no dysarthria noted. There is no hypophonia. There is no lip, neck/head, jaw or voice tremor. Neck is supple with full range of passive and active motion. There are no carotid bruits on auscultation. Oropharynx exam reveals: No significant mouth dryness, adequate dental hygiene with some teeth missing, moderate airway crowding but difficult to examine airway with Mallampati class IV, small airway entry noted, thicker soft palate, tonsils not fully visualized.  Neck circumference of 20 1/8 inches.  Tongue protrudes centrally and palate elevates symmetrically.    Chest: Clear to auscultation without wheezing, rhonchi or crackles noted.  Heart: S1+S2+0, regular and normal without murmurs, rubs or gallops noted.   Abdomen: Soft, non-tender and non-distended with normal bowel sounds appreciated on auscultation.  Extremities: There is no pitting edema in the distal lower extremities bilaterally.   Skin: Warm  and dry without trophic changes noted.   Musculoskeletal: exam reveals no obvious joint deformities, tenderness or joint swelling or erythema.   Neurologically:  Mental status: The patient is awake, alert and oriented in all 4 spheres. His immediate and remote memory, attention, language skills and fund of knowledge are appropriate. There is no evidence of aphasia, agnosia, apraxia or anomia. Speech is clear with normal prosody and enunciation. Thought process is linear. Mood is normal and affect is normal.  Cranial nerves II - XII are as described above under HEENT exam.  Motor exam: Normal bulk, strength and tone is noted. There is no tremor, fine motor skills and coordination: grossly intact.  Cerebellar testing: No dysmetria or intention tremor. There is no  truncal or gait ataxia.  Sensory exam: intact to light touch in the upper and lower extremities.  Gait, station and balance: He stands easily. No veering to one side is noted. No leaning to one side is noted. Posture is age-appropriate and stance is narrow based. Gait shows normal stride length and normal pace. No problems turning are noted.   Assessment and Plan:   In summary, Keith Day is a very pleasant 70 y.o.-year old male  with an underlying medical history of diabetes, hypertension, diverticulitis, and obesity, whose history and physical exam are concerning for obstructive sleep apnea (OSA). I had a long chat with the patient about my findings and the diagnosis of OSA, its prognosis and treatment options. We talked about medical treatments, surgical interventions and non-pharmacological approaches. I explained in particular the risks and ramifications of untreated moderate to severe OSA, especially with respect to developing cardiovascular disease down the Road, including congestive heart failure, difficult to treat hypertension, cardiac arrhythmias, or stroke. Even type 2 diabetes has, in part, been linked to untreated OSA. Symptoms  of untreated OSA include daytime sleepiness, memory problems, mood irritability and mood disorder such as depression and anxiety, lack of energy, as well as recurrent headaches, especially morning headaches. We talked about trying to maintain a healthy lifestyle in general, as well as the importance of weight control. We also talked about the importance of good sleep hygiene. I recommended the following at this time: sleep study; he would prefer a Friday night.  I explained the sleep test procedure to the patient and also outlined possible surgical and non-surgical treatment options of OSA, including the use of a custom-made dental device (which would require a referral to a specialist dentist or oral surgeon), upper airway surgical options, such as traditional UPPP or a novel less invasive surgical option in the form of Inspire hypoglossal nerve stimulation (which would involve a referral to an ENT surgeon). I also explained the CPAP treatment option to the patient.  We will pick up our discussion after testing.   I answered all his questions today and the patient was in agreement. I plan to see him back after the sleep study is completed and encouraged him to call with any interim questions, concerns, problems or updates.   Thank you very much for allowing me to participate in the care of this nice patient. If I can be of any further assistance to you please do not hesitate to call me at [phone].  Sincerely,   Star Age, MD, PhD

## 2020-06-13 ENCOUNTER — Other Ambulatory Visit: Payer: Self-pay

## 2020-06-13 ENCOUNTER — Ambulatory Visit (INDEPENDENT_AMBULATORY_CARE_PROVIDER_SITE_OTHER): Payer: Medicare Other | Admitting: Neurology

## 2020-06-13 DIAGNOSIS — R0683 Snoring: Secondary | ICD-10-CM

## 2020-06-13 DIAGNOSIS — G4733 Obstructive sleep apnea (adult) (pediatric): Secondary | ICD-10-CM | POA: Diagnosis not present

## 2020-06-13 DIAGNOSIS — Z9189 Other specified personal risk factors, not elsewhere classified: Secondary | ICD-10-CM

## 2020-06-13 DIAGNOSIS — E669 Obesity, unspecified: Secondary | ICD-10-CM

## 2020-06-13 DIAGNOSIS — G4720 Circadian rhythm sleep disorder, unspecified type: Secondary | ICD-10-CM

## 2020-06-13 DIAGNOSIS — R351 Nocturia: Secondary | ICD-10-CM

## 2020-06-13 DIAGNOSIS — G479 Sleep disorder, unspecified: Secondary | ICD-10-CM

## 2020-06-27 NOTE — Progress Notes (Signed)
Patient referred by Dr. Dennard Schaumann, seen by me on 05/12/20, diagnostic PSG on 06/13/20.   Please call and notify the patient that the recent sleep study showed moderate to severe obstructive sleep apnea with significant desaturations, as low as 66%. I recommend treatment in the form of CPAP. This will require a repeat sleep study for proper titration and mask fitting and correct monitoring of the oxygen saturations. Please explain to patient. I have placed an order in the chart. Thanks.  Star Age, MD, PhD [facility])

## 2020-06-27 NOTE — Procedures (Signed)
PATIENT'S NAME:  Keith Day, Keith Day DOB:      10/01/1950      MR#:    [id number]     DATE OF RECORDING: 06/13/2020 REFERRING M.D.:  Jenna Luo, MD Study Performed:   Baseline Polysomnogram HISTORY: 70 year old man with a history of diabetes, hypertension, diverticulitis, and obesity, who reports snoring and some difficulty maintaining sleep. The patient endorsed the Epworth Sleepiness Scale at 9 points. The patient's weight 278 pounds with a height of 71 (inches), resulting in a BMI of 38.9 kg/m2. The patient's neck circumference measured 20.2 inches.  CURRENT MEDICATIONS: Lipitor, Zestril   PROCEDURE:  This is a multichannel digital polysomnogram utilizing the Somnostar 11.2 system.  Electrodes and sensors were applied and monitored per AASM Specifications.   EEG, EOG, Chin and Limb EMG, were sampled at 200 Hz.  ECG, Snore and Nasal Pressure, Thermal Airflow, Respiratory Effort, CPAP Flow and Pressure, Oximetry was sampled at 50 Hz. Digital video and audio were recorded.      BASELINE STUDY  Lights Out was at 21:47 and Lights On at 04:02.  Total recording time (TRT) was 375.5 minutes, with a total sleep time (TST) of 288.5 minutes.   The patient's sleep latency was 5.5 minutes.  REM latency was 139 minutes, which is delayed. The sleep efficiency was 76.8 %.     SLEEP ARCHITECTURE: WASO (Wake after sleep onset) was 81.5 minutes with mild to moderate sleep fragmentation noted. There were 28.5 minutes in Stage N1, 217.5 minutes Stage N2, 21.5 minutes Stage N3 and 21 minutes in Stage REM.  The percentage of Stage N1 was 9.9%, which is increased, Stage N2 was 75.4%, which is markedly increased, Stage N3 was 7.5% and Stage R (REM sleep) was 7.3% which is significantly reduced. The arousals were noted as: 51 were spontaneous, 0 were associated with PLMs, 78 were associated with respiratory events.  RESPIRATORY ANALYSIS:  There were a total of 134 respiratory events:  51 obstructive apneas, 0 central apneas  and 0 mixed apneas with a total of 51 apneas and an apnea index (AI) of 10.6 /hour. There were 83 hypopneas with a hypopnea index of 17.3 /hour. The patient also had 0 respiratory event related arousals (RERAs).      The total APNEA/HYPOPNEA INDEX (AHI) was 27.9/hour and the total RESPIRATORY DISTURBANCE INDEX was  27.9 /hour.  10 events occurred in REM sleep and 156 events in NREM. The REM AHI was  28.6 /hour, versus a non-REM AHI of 27.8. The patient spent 80 minutes of total sleep time in the supine position and 209 minutes in non-supine.. The supine AHI was 68.3 versus a non-supine AHI of 12.4.  OXYGEN SATURATION & C02:  The Wake baseline 02 saturation was 93%, with the lowest being 66%. Time spent below 89% saturation equaled 61 minutes.  PERIODIC LIMB MOVEMENTS: The patient had a total of 0 Periodic Limb Movements.  The Periodic Limb Movement (PLM) index was 0 and the PLM Arousal index was 0/hour.  Audio and video analysis did not show any abnormal or unusual movements, behaviors, phonations or vocalizations. The patient took 1 bathroom break. Mild to moderate snoring was noted. The EKG was in keeping with normal sinus rhythm (NSR).  Post-study, the patient indicated that sleep was worse than usual.   IMPRESSION:  1. Obstructive Sleep Apnea (OSA) 2. Dysfunctions associated with sleep stages or arousal from sleep  RECOMMENDATIONS:  1. This study demonstrates moderate to severe obstructive sleep apnea, with a total AHI of 27.9/hour,  REM AHI of 28.6/hour, supine AHI of 68.3/hour and O2 nadir of 66%. Treatment with positive airway pressure in the form of CPAP is recommended. This will require a full night titration study to optimize therapy. Other treatment options may be limited, due to the severity of his sleep apnea, but - generally speaking - may include avoidance of supine sleep position along with weight loss, upper airway or jaw surgery in selected patients or the use of an oral appliance  in certain patients. ENT evaluation and/or consultation with a maxillofacial surgeon or dentist may be feasible in some instances.    2. Please note that untreated obstructive sleep apnea may carry additional perioperative morbidity. Patients with significant obstructive sleep apnea should receive perioperative PAP therapy and the surgeons and particularly the anesthesiologist should be informed of the diagnosis and the severity of the sleep disordered breathing. 3. This study shows sleep fragmentation and abnormal sleep stage percentages; these are nonspecific findings and per se do not signify an intrinsic sleep disorder or a cause for the patient's sleep-related symptoms. Causes include (but are not limited to) the first night effect of the sleep study, circadian rhythm disturbances, medication effect or an underlying mood disorder or medical problem.  4. The patient should be cautioned not to drive, work at heights, or operate dangerous or heavy equipment when tired or sleepy. Review and reiteration of good sleep hygiene measures should be pursued with any patient. 5. The patient will be seen in follow-up in the sleep clinic at [facility] for discussion of the test results, symptom and treatment compliance review, further management strategies, etc. The referring provider will be notified of the test results.  I certify that I have reviewed the entire raw data recording prior to the issuance of this report in accordance with the Standards of Accreditation of the American Academy of Sleep Medicine (AASM)   Star Age, MD, PhD Diplomat, American Board of Neurology and Sleep Medicine (Neurology and Sleep Medicine)

## 2020-06-27 NOTE — Addendum Note (Signed)
Addended by: Star Age on: 06/27/2020 11:18 AM   Modules accepted: Orders

## 2020-06-30 ENCOUNTER — Telehealth: Payer: Self-pay

## 2020-06-30 NOTE — Telephone Encounter (Signed)
I called pt. I advised pt that Dr. Rexene Alberts reviewed their sleep study results and found that severe/moderate OSA was found and recommends that pt be treated with a cpap. Dr. Rexene Alberts recommends that pt return for a repeat sleep study in order to properly titrate the cpap and ensure a good mask fit. Pt is agreeable to returning for a titration study. I advised pt that our sleep lab will file with pt's insurance and call pt to schedule the sleep study when we hear back from the pt's insurance regarding coverage of this sleep study. Pt verbalized understanding of results. Pt had no questions at this time but was encouraged to call back if questions arise.

## 2020-06-30 NOTE — Telephone Encounter (Signed)
-----   Message from Star Age, MD sent at 06/27/2020 11:18 AM EDT ----- Patient referred by Dr. Dennard Schaumann, seen by me on 05/12/20, diagnostic PSG on 06/13/20.   Please call and notify the patient that the recent sleep study showed moderate to severe obstructive sleep apnea with significant desaturations, as low as 66%. I recommend treatment in the form of CPAP. This will require a repeat sleep study for proper titration and mask fitting and correct monitoring of the oxygen saturations. Please explain to patient. I have placed an order in the chart. Thanks.  Star Age, MD, PhD [facility])

## 2020-07-03 ENCOUNTER — Telehealth: Payer: Self-pay

## 2020-07-03 NOTE — Telephone Encounter (Signed)
LVM for pt to call me back to schedule sleep study  

## 2020-07-08 ENCOUNTER — Ambulatory Visit (INDEPENDENT_AMBULATORY_CARE_PROVIDER_SITE_OTHER): Payer: Medicare Other | Admitting: Neurology

## 2020-07-08 DIAGNOSIS — G4733 Obstructive sleep apnea (adult) (pediatric): Secondary | ICD-10-CM | POA: Diagnosis not present

## 2020-07-18 NOTE — Progress Notes (Signed)
Patient referred by Dr. Dennard Schaumann, seen by me on 05/12/20, diagnostic PSG on 06/13/20. He had an attempted titration study on 07/08/20, but did not sleep and did not stay. CPAP and BiPAP and various masks were sampled. We can try home autoPAP therapy if he would like to try. If he would rather have a FU appointment first, we can discuss during appointment. Please let me know.

## 2020-07-18 NOTE — Procedures (Signed)
  PATIENT'S NAME:  Keith Day, Keith Day DOB:      May 31, 1950      MR#:    [id number]     DATE OF RECORDING: 07/08/2020 REFERRING M.D.:  Jenna Luo, MD Study Performed:   CPAP  Titration HISTORY:  70 year old man with a history of diabetes, hypertension, diverticulitis, and obesity, who presents for a full night titration study to treat his obstructive sleep apnea. His baseline sleep study from 06/13/20 showed moderate to severe obstructive sleep apnea, with a total AHI of 27.9/hour, REM AHI of 28.6/hour, supine AHI of 68.3/hour and O2 nadir of 66%.   CURRENT MEDICATIONS: Lipitor, Zestril  PROCEDURE:  This is a multichannel digital polysomnogram utilizing the SomnoStar 11.2 system.  Electrodes and sensors were applied and monitored per AASM Specifications.   EEG, EOG, Chin and Limb EMG, were sampled at 200 Hz.  ECG, Snore and Nasal Pressure, Thermal Airflow, Respiratory Effort, CPAP Flow and Pressure, Oximetry was sampled at 50 Hz. Digital video and audio were recorded.      CPAP was attempted: This study was not completed. The patient could not tolerate CPAP.  He could not tolerate different masks that were tried.  CPAP and BiPAP were sampled but patient could not tolerate and requested to end the study.  No sleep was achieved during this study.  Lights Out was at 22:10 and Lights On at 22:26.   SLEEP ARCHITECTURE: No sleep achieved.   RESPIRATORY ANALYSIS:  n/a.   The total APNEA/HYPOPNEA INDEX  (AHI) was n/a.  OXYGEN SATURATION & C02:  The baseline 02 saturation was 94%.  PERIODIC LIMB MOVEMENTS: n/a.  DIAGNOSIS: 1. Obstructive Sleep Apnea   PLANS/RECOMMENDATIONS: This study was not completed. The patient will be offered to start an autoPAP titration/trial at home for now. Alternative treatment options may include surgical options, e.g. such as Inspire (an implantable tongue nerve stimulator), dental option with an oral appliance. The patient will be seen in follow-up in the sleep clinic at  [facility] for discussion of the test results, further management strategies, etc. The referring provider will be notified of the test results.    I certify that I have reviewed the entire raw data recording prior to the issuance of this report in accordance with the Standards of Accreditation of the American Academy of Sleep Medicine (AASM)   Star Age, MD, PhD Diplomat, American Board of Neurology and Sleep Medicine (Neurology and Sleep Medicine)

## 2020-07-21 ENCOUNTER — Telehealth: Payer: Self-pay

## 2020-07-21 NOTE — Telephone Encounter (Signed)
-----   Message from Star Age, MD sent at 07/18/2020 12:38 PM EDT ----- Patient referred by Dr. Dennard Schaumann, seen by me on 05/12/20, diagnostic PSG on 06/13/20. He had an attempted titration study on 07/08/20, but did not sleep and did not stay. CPAP and BiPAP and various masks were sampled. We can try home autoPAP therapy if he would like to try. If he would rather have a FU appointment first, we can discuss during appointment. Please let me know.

## 2020-07-21 NOTE — Telephone Encounter (Signed)
I called the pt and we discussed message. He states he had trouble tolerating the mask and had to leave.  Pt sts he would like to come in and talk with the MD. Pt sts he needs a late after noon appointment or a Friday. I advised the pt Dr. Rexene Alberts did not work on Fridays and latest f/u appt would be 200 pm. Pt accepted an appt for 09/23/20 at 200 pm and was advised to show up 15-30 mins early for this appt.

## 2020-08-05 ENCOUNTER — Ambulatory Visit (INDEPENDENT_AMBULATORY_CARE_PROVIDER_SITE_OTHER): Payer: Medicare Other | Admitting: Family Medicine

## 2020-08-05 ENCOUNTER — Other Ambulatory Visit: Payer: Self-pay

## 2020-08-05 VITALS — BP 136/80 | HR 96 | Temp 98.2°F | Resp 14 | Ht 71.0 in | Wt 280.0 lb

## 2020-08-05 DIAGNOSIS — L0291 Cutaneous abscess, unspecified: Secondary | ICD-10-CM

## 2020-08-05 MED ORDER — SULFAMETHOXAZOLE-TRIMETHOPRIM 800-160 MG PO TABS: 1.0000 | ORAL_TABLET | Freq: Two times a day (BID) | ORAL | 0 refills | Status: DC

## 2020-08-05 NOTE — Progress Notes (Addendum)
   Subjective:    Patient ID: Keith Day, male    DOB: 1950/09/29, 70 y.o.   MRN: [id number]  HPI Patient reports a 3-day history of a painful boil.  It is located on the right side of the base of his scrotum just anterior to the rectum.  Is roughly 3 cm in diameter.  It is erythematous warm fluctuant and tender. Past Surgical History:  Procedure Laterality Date  . APPENDECTOMY    . FRACTURE SURGERY     Past Medical History:  Diagnosis Date  . Colon polyps   . Diabetes mellitus without complication ([place])   . Diverticulitis   . Hypertension    Current Outpatient Medications on File Prior to Visit  Medication Sig Dispense Refill  . atorvastatin (LIPITOR) 20 MG tablet TAKE 1 TABLET BY MOUTH  DAILY 30 tablet 11  . lisinopril (ZESTRIL) 10 MG tablet TAKE 1 TABLET BY MOUTH  DAILY 30 tablet 11   No current facility-administered medications on file prior to visit.   No Known Allergies Social History   Socioeconomic History  . Marital status: Married    Spouse name: Not on file  . Number of children: Not on file  . Years of education: Not on file  . Highest education level: Not on file  Occupational History  . Not on file  Tobacco Use  . Smoking status: Former Smoker    Quit date: 02/21/1998    Years since quitting: 22.4  . Smokeless tobacco: Never Used  Substance and Sexual Activity  . Alcohol use: Yes  . Drug use: No  . Sexual activity: Not on file  Other Topics Concern  . Not on file  Social History Narrative  . Not on file   Social Determinants of Health   Financial Resource Strain: Not on file  Food Insecurity: Not on file  Transportation Needs: Not on file  Physical Activity: Not on file  Stress: Not on file  Social Connections: Not on file  Intimate Partner Violence: Not on file      Review of Systems  All other systems reviewed and are negative.      Objective:   Physical Exam Vitals reviewed.  Constitutional:      Appearance: He is obese.   Cardiovascular:     Rate and Rhythm: Normal rate and regular rhythm.     Heart sounds: Normal heart sounds.  Pulmonary:     Effort: Pulmonary effort is normal.     Breath sounds: Normal breath sounds.  Genitourinary:   Neurological:     General: No focal deficit present.     Mental Status: He is alert and oriented to person, place, and time. Mental status is at baseline.           Assessment & Plan:  Abscess  Area was anesthetized with 0.1% lidocaine without epinephrine.  A 1 cm vertical incision was made and copious purulent material was expressed.  The wound was then cleaned thoroughly with Q-tip soaked in peroxide.  Wound culture was sent.  The wound was packed with 3 inches of 1/4 inch iodoform gauze.  Patient was started on Bactrim double strength tablets 1 p.o. twice daily for 7 days and wound care was discussed.  Remove all packing tomorrow.  Soak in warm salt water.  Below the area clean and dry with a hair dryer.  Cover with Neosporin.  Allow secondary intention closure over a period of 7 days

## 2020-08-05 NOTE — Addendum Note (Signed)
Addended by: Sheral Flow on: 08/05/2020 04:15 PM   Modules accepted: Orders

## 2020-08-08 LAB — WOUND CULTURE
MICRO NUMBER:: 11900764
SPECIMEN QUALITY:: ADEQUATE

## 2020-09-23 ENCOUNTER — Ambulatory Visit: Payer: Medicare Other | Admitting: Neurology

## 2020-09-23 ENCOUNTER — Other Ambulatory Visit: Payer: Self-pay

## 2020-09-23 ENCOUNTER — Telehealth: Payer: Self-pay | Admitting: *Deleted

## 2020-09-23 ENCOUNTER — Encounter: Payer: Self-pay | Admitting: Neurology

## 2020-09-23 VITALS — BP 131/70 | HR 88 | Ht 71.0 in | Wt 283.4 lb

## 2020-09-23 DIAGNOSIS — G4733 Obstructive sleep apnea (adult) (pediatric): Secondary | ICD-10-CM

## 2020-09-23 NOTE — Patient Instructions (Signed)
We will set you up at home with a so called autoPAP machine for treatment of your moderate to severe obstructive sleep apnea.  Please use your autoPAP regularly. While your insurance requires that you use PAP at least 4 hours each night on 70% of the nights, I recommend, that you not skip any nights and use it throughout the night if you can. Getting used to PAP and staying with the treatment long term does take time and patience and discipline. Untreated obstructive sleep apnea when it is moderate to severe can have an adverse impact on cardiovascular health and raise her risk for heart disease, arrhythmias, hypertension, congestive heart failure, stroke and diabetes. Untreated obstructive sleep apnea causes sleep disruption, nonrestorative sleep, and sleep deprivation. This can have an impact on your day to day functioning and cause daytime sleepiness and impairment of cognitive function, memory loss, mood disturbance, and problems focussing. Using PAP regularly can improve these symptoms. Please schedule a follow-up appointment within 31 to 89 days after starting the machine.  We will put you on the schedule for about 3 months to see one of our nurse practitioners in anticipation.  We may have to push out the appointment according to when you actually get your machine.  Please be aware that your set up date is the day that you get your machine.

## 2020-09-23 NOTE — Telephone Encounter (Signed)
Secure message sent to [employer] team for new start auto-CPAP. Patient has not preference for DME company. He will schedule a 3 month follow-up and is aware we need 31-89 days of data once he starts the CPAP.

## 2020-09-23 NOTE — Progress Notes (Signed)
Subjective:    Patient ID: Keith Day is a 70 y.o. male.  HPI    Interim history:   Keith Day is a 70 year old right-handed gentleman with an underlying medical history of diabetes, hypertension, diverticulitis, and obesity, who presents for follow-up consultation of his sleep disturbance, including obstructive sleep apnea.  The patient is unaccompanied today.  I first met him at the request of his primary care physician on 05/12/2020, at which time he reported snoring and difficulty maintaining sleep.  He was advised to proceed with a sleep study.  He had a baseline sleep study on 06/13/2020, which showed a sleep latency of 5.5 minutes, sleep efficiency 76.8%, REM latency 139 minutes.  He had a total AHI of 27.9/h, REM AHI 28.6/h, supine AHI 68.3/h.  Average oxygen saturation was 93%, nadir was 66%.  Time below 89% saturation was 61 minutes.  He was advised to return for a titration study.  He had an attempt at CPAP titration on 07/08/2020 but did not achieve any sleep.  He was advised to consider AutoPap therapy.  He requested a follow-up appointment to discuss further.   Today, 09/23/2020: He reports no new symptoms.  He reports that he could not tolerate the air at the time of his CPAP titration.  He is not sure if he is going to be able to use the machine but is willing to try.  The patient's allergies, current medications, family history, past medical history, past social history, past surgical history and problem list were reviewed and updated as appropriate.  Previously:   05/12/20: (He) reports snoring and some difficulty maintaining sleep.  He has no difficulty going to sleep.  Snoring is reported by his wife, he denies any morning headaches but does have nocturia about once per average night.  He does not have a very set schedule.  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  I reviewed your office note from 04/11/2020.  His Epworth sleepiness score is 9 out of 24, fatigue severity score is 24 out of 63.  Generally he is in bed between 9 and 10 PM and rise time is around 3 AM but he goes to the dining and watches TV, generally dozes off again and does not have a set get up time.  He lives with his wife.  He has no obvious family history of sleep apnea.  He quit smoking some 25 years ago and drinks alcohol in the form of 1 shot or 1 beer in the evenings.  He has gained weight over time, slowly over the years but more significantly so in the past couple years.  He has a TV in the bedroom and watches it at night, the try to turn it off in the middle of the night.  He drinks caffeine in the form of coffee, 1 cup in the morning and 1 soda per day on average, no tea.    His Past Medical History Is Significant For: Past Medical History:  Diagnosis Date   Colon polyps    Diabetes mellitus without complication ([place])    Diverticulitis    Hypertension     His Past Surgical History Is Significant For: Past Surgical History:  Procedure Laterality Date   APPENDECTOMY     FRACTURE  SURGERY      His Family History Is Significant For: Family History  Problem Relation Age of Onset   Cancer Father 17       Lung Cancer    His Social History Is Significant For: Social History   Socioeconomic History   Marital status: Married    Spouse name: Not on file   Number of children: Not on file   Years of education: Not on file   Highest education level: Not on file  Occupational History   Not on file  Tobacco Use   Smoking status: Former    Pack years: 0.00    Types: Cigarettes    Quit date: 02/21/1998    Years since quitting: 22.6   Smokeless tobacco: Never  Substance and Sexual Activity   Alcohol use: Yes   Drug use: No   Sexual activity: Not on file  Other Topics Concern   Not on file  Social History Narrative    Not on file   Social Determinants of Health   Financial Resource Strain: Not on file  Food Insecurity: Not on file  Transportation Needs: Not on file  Physical Activity: Not on file  Stress: Not on file  Social Connections: Not on file    His Allergies Are:  No Known Allergies:   His Current Medications Are:  Outpatient Encounter Medications as of 09/23/2020  Medication Sig   atorvastatin (LIPITOR) 20 MG tablet TAKE 1 TABLET BY MOUTH  DAILY   lisinopril (ZESTRIL) 10 MG tablet TAKE 1 TABLET BY MOUTH  DAILY   sulfamethoxazole-trimethoprim (BACTRIM DS) 800-160 MG tablet Take 1 tablet by mouth 2 (two) times daily. (Patient not taking: Reported on 09/23/2020)   No facility-administered encounter medications on file as of 09/23/2020.  :  Review of Systems:  Out of a complete 14 point review of systems, all are reviewed and negative with the exception of these symptoms as listed below:  Review of Systems  Neurological:        F/u for sleep study.    Objective:  Neurological Exam  Physical Exam Physical Examination:   Vitals:   09/23/20 1402  BP: 131/70  Pulse: 88    General Examination: The patient is a very pleasant 70 y.o. male in no acute distress. He appears well-developed and well-nourished and well groomed.   HEENT: Normocephalic, atraumatic, pupils are equal, round and reactive to light, extraocular tracking is well-preserved, corrective eyeglasses in place.  Face is symmetric with normal facial animation.  No difficulty breathing through his nose.  Airway examination reveals stable findings.  No carotid bruits.   Chest: Clear to auscultation without wheezing, rhonchi or crackles noted.   Heart: S1+S2+0, regular and normal without murmurs, rubs or gallops noted.   Abdomen: Soft, non-tender and non-distended.   Extremities: There is no obvious edema in the distal lower extremities bilaterally.   Skin: Warm and dry without trophic changes noted.   Musculoskeletal:  exam reveals no obvious joint deformities.   Neurologically: Mental status: The patient is awake, alert and oriented in all 4 spheres. His immediate and remote memory, attention, language skills and fund of knowledge are appropriate. There is no evidence of aphasia, agnosia, apraxia or anomia. Speech is clear with normal prosody and enunciation. Thought process is linear. Mood is normal and affect is normal. Cranial nerves II - XII are as described above under HEENT exam. Motor exam: Normal bulk, strength and tone is noted. There is no tremor, fine motor skills and  coordination: grossly intact. Cerebellar testing: No dysmetria or intention tremor. There is no truncal or gait ataxia. Sensory exam: intact to light touch in the upper and lower extremities.   Assessment and Plan:    In summary, Keith Day is a very pleasant 70 year old male  with an underlying medical history of diabetes, hypertension, diverticulitis, and obesity, who presents for follow-up consultation of his sleep disturbance after interim sleep testing.  His baseline sleep study from 06/13/2020 showed moderate to severe obstructive sleep apnea with a total AHI of 27.9/h, O2 nadir 66%, supine AHI was in the severe range at 68.3/h.  He was unable to achieve any sleep during his attempted titration study on 07/08/2020.  We talked about his sleep study results and treatment options.  He is advised that positive airway pressure is his best treatment option and he is willing to try AutoPap therapy.  I will send an order to a local DME company and once he is set up with treatment, he is advised to try to be compliant with treatment not just for insurance compliance purposes but also to reap benefit from treatment.  It will be an adjustment and we will start with a lower pressure setting, minimum pressure of 4, maximum of 11 cm.  He is advised that with time we may have to adjust his pressure to optimize treatment.  He is advised to follow-up to  see one of our nurse practitioners within 3 months of starting therapy.  He is encouraged to be compliant with treatment and call us with any interim questions or concerns.  He was in agreement with the plan. I spent 20 minutes in total face-to-face time and in reviewing records during pre-charting, more than 50% of which was spent in counseling and coordination of care, reviewing test results, reviewing medications and treatment regimen and/or in discussing or reviewing the diagnosis of OSA, the prognosis and treatment options. Pertinent laboratory and imaging test results that were available during this visit with the patient were reviewed by me and considered in my medical decision making (see chart for details).

## 2020-11-10 DIAGNOSIS — G4733 Obstructive sleep apnea (adult) (pediatric): Secondary | ICD-10-CM | POA: Diagnosis not present

## 2020-12-11 DIAGNOSIS — G4733 Obstructive sleep apnea (adult) (pediatric): Secondary | ICD-10-CM | POA: Diagnosis not present

## 2021-01-07 NOTE — Progress Notes (Deleted)
PATIENT: Keith Day DOB: Sep 27, 1950  REASON FOR VISIT: follow up HISTORY FROM: patient  No chief complaint on file.    HISTORY OF PRESENT ILLNESS:  01/07/21 ALL:  Keith Day is a 70 y.o. male here today for follow up for OSA on CPAP.      HISTORY: (copied from Dr Guadelupe Sabin previous note)  Keith Day is a 70 year old right-handed gentleman with an underlying medical history of diabetes, hypertension, diverticulitis, and obesity, who presents for follow-up consultation of his sleep disturbance, including obstructive sleep apnea.  The patient is unaccompanied today.  I first met him at the request of his primary care physician on 05/12/2020, at which time he reported snoring and difficulty maintaining sleep.  He was advised to proceed with a sleep study.  He had a baseline sleep study on 06/13/2020, which showed a sleep latency of 5.5 minutes, sleep efficiency 76.8%, REM latency 139 minutes.  He had a total AHI of 27.9/h, REM AHI 28.6/h, supine AHI 68.3/h.  Average oxygen saturation was 93%, nadir was 66%.  Time below 89% saturation was 61 minutes.  He was advised to return for a titration study.  He had an attempt at CPAP titration on 07/08/2020 but did not achieve any sleep.  He was advised to consider AutoPap therapy.  He requested a follow-up appointment to discuss further.   Today, 09/23/2020: He reports no new symptoms.  He reports that he could not tolerate the air at the time of his CPAP titration.  He is not sure if he is going to be able to use the machine but is willing to try.   REVIEW OF SYSTEMS: Out of a complete 14 system review of symptoms, the patient complains only of the following symptoms, and all other reviewed systems are negative.  ESS:  ALLERGIES: No Known Allergies  HOME MEDICATIONS: Outpatient Medications Prior to Visit  Medication Sig Dispense Refill   atorvastatin (LIPITOR) 20 MG tablet TAKE 1 TABLET BY MOUTH  DAILY 30 tablet 11   lisinopril (ZESTRIL) 10  MG tablet TAKE 1 TABLET BY MOUTH  DAILY 30 tablet 11   sulfamethoxazole-trimethoprim (BACTRIM DS) 800-160 MG tablet Take 1 tablet by mouth 2 (two) times daily. (Patient not taking: Reported on 09/23/2020) 14 tablet 0   No facility-administered medications prior to visit.    PAST MEDICAL HISTORY: Past Medical History:  Diagnosis Date   Colon polyps    Diabetes mellitus without complication ([place])    Diverticulitis    Hypertension     PAST SURGICAL HISTORY: Past Surgical History:  Procedure Laterality Date   APPENDECTOMY     FRACTURE SURGERY      FAMILY HISTORY: Family History  Problem Relation Age of Onset   Cancer Father 60       Lung Cancer    SOCIAL HISTORY: Social History   Socioeconomic History   Marital status: Married    Spouse name: Not on file   Number of children: Not on file   Years of education: Not on file   Highest education level: Not on file  Occupational History   Not on file  Tobacco Use   Smoking status: Former    Types: Cigarettes    Quit date: 02/21/1998    Years since quitting: 22.8   Smokeless tobacco: Never  Substance and Sexual Activity   Alcohol use: Yes   Drug use: No   Sexual activity: Not on file  Other Topics Concern   Not on file  Social History Narrative   Not on file   Social Determinants of Health   Financial Resource Strain: Not on file  Food Insecurity: Not on file  Transportation Needs: Not on file  Physical Activity: Not on file  Stress: Not on file  Social Connections: Not on file  Intimate Partner Violence: Not on file     PHYSICAL EXAM  There were no vitals filed for this visit. There is no height or weight on file to calculate BMI.  Generalized: Well developed, in no acute distress  Cardiology: normal rate and rhythm, no murmur noted Respiratory: clear to auscultation bilaterally  Neurological examination  Mentation: Alert oriented to time, place, history taking. Follows all commands speech and language  fluent Cranial nerve II-XII: Pupils were equal round reactive to light. Extraocular movements were full, visual field were full  Motor: The motor testing reveals 5 over 5 strength of all 4 extremities. Good symmetric motor tone is noted throughout.  Gait and station: Gait is normal.    DIAGNOSTIC DATA (LABS, IMAGING, TESTING) - I reviewed patient records, labs, notes, testing and imaging myself where available.  No flowsheet data found.   Lab Results  Component Value Date   WBC 8.7 06/05/2015   HGB 16.0 06/05/2015   HCT 46.5 06/05/2015   MCV 87.1 06/05/2015   PLT 221 06/05/2015      Component Value Date/Time   NA 140 07/13/2017 0822   K 4.7 07/13/2017 0822   CL 105 07/13/2017 0822   CO2 26 07/13/2017 0822   GLUCOSE 102 (H) 07/13/2017 0822   BUN 16 07/13/2017 0822   CREATININE 1.23 07/13/2017 0822   CALCIUM 9.4 07/13/2017 0822   PROT 6.9 07/13/2017 0822   ALBUMIN 4.5 06/05/2015 1247   AST 27 07/13/2017 0822   ALT 29 07/13/2017 0822   ALKPHOS 63 06/05/2015 1247   BILITOT 0.7 07/13/2017 0822   GFRNONAA 60 07/13/2017 0822   GFRAA 70 07/13/2017 0822   Lab Results  Component Value Date   CHOL 136 07/13/2017   HDL 45 07/13/2017   LDLCALC 70 07/13/2017   TRIG 128 07/13/2017   CHOLHDL 3.0 07/13/2017   No results found for: HGBA1C No results found for: VITAMINB12 Lab Results  Component Value Date   TSH 0.98 07/13/2017     ASSESSMENT AND PLAN 70 y.o. year old male  has a past medical history of Colon polyps, Diabetes mellitus without complication ([place]), Diverticulitis, and Hypertension. here with   No diagnosis found.    Keith Day is doing well on CPAP therapy. Compliance report reveals ***. *** was encouraged to continue using CPAP nightly and for greater than 4 hours each night. We will update supply orders as indicated. Risks of untreated sleep apnea review and education materials provided. Healthy lifestyle habits encouraged. *** will follow up in ***, sooner  if needed. *** verbalizes understanding and agreement with this plan.    No orders of the defined types were placed in this encounter.    No orders of the defined types were placed in this encounter.     Debbora Presto, FNP-C 01/07/2021, 11:02 AM [facility] [address] [place], [place] [address] [phone]

## 2021-01-08 ENCOUNTER — Ambulatory Visit: Payer: Medicare Other | Admitting: Family Medicine

## 2021-01-10 DIAGNOSIS — G4733 Obstructive sleep apnea (adult) (pediatric): Secondary | ICD-10-CM | POA: Diagnosis not present

## 2021-01-12 DIAGNOSIS — Z1152 Encounter for screening for COVID-19: Secondary | ICD-10-CM | POA: Diagnosis not present

## 2021-01-12 DIAGNOSIS — R059 Cough, unspecified: Secondary | ICD-10-CM | POA: Diagnosis not present

## 2021-01-12 DIAGNOSIS — Z9189 Other specified personal risk factors, not elsewhere classified: Secondary | ICD-10-CM | POA: Diagnosis not present

## 2021-01-22 ENCOUNTER — Ambulatory Visit (INDEPENDENT_AMBULATORY_CARE_PROVIDER_SITE_OTHER): Payer: Medicare Other

## 2021-01-22 ENCOUNTER — Other Ambulatory Visit: Payer: Self-pay

## 2021-01-22 VITALS — Ht 71.0 in | Wt 283.0 lb

## 2021-01-22 DIAGNOSIS — Z1211 Encounter for screening for malignant neoplasm of colon: Secondary | ICD-10-CM | POA: Diagnosis not present

## 2021-01-22 DIAGNOSIS — Z0000 Encounter for general adult medical examination without abnormal findings: Secondary | ICD-10-CM | POA: Diagnosis not present

## 2021-01-22 DIAGNOSIS — Z1212 Encounter for screening for malignant neoplasm of rectum: Secondary | ICD-10-CM

## 2021-01-22 NOTE — Progress Notes (Signed)
Subjective:   Keith Day is a 70 y.o. male who presents for an Initial Medicare Annual Wellness Visit. Virtual Visit via Telephone Note  I connected with  Keith Day on 01/22/21 at  9:00 AM EDT by telephone and verified that I am speaking with the correct person using two identifiers.  Location: Patient: Home Provider: [facility] Persons participating in the virtual visit: patient/Nurse Health Advisor   I discussed the limitations, risks, security and privacy concerns of performing an evaluation and management service by telephone and the availability of in person appointments. The patient expressed understanding and agreed to proceed.  Interactive audio and video telecommunications were attempted between this nurse and patient, however failed, due to patient having technical difficulties OR patient did not have access to video capability.  We continued and completed visit with audio only.  Some vital signs may be absent or patient reported.   Chriss Driver, LPN  Review of Systems     Cardiac Risk Factors include: advanced age (>4men, >41 women)     Objective:    Today's Vitals   01/22/21 0904  Weight: 283 lb (128.4 kg)  Height: 5\' 11"  (1.803 m)   Body mass index is 39.47 kg/m.  Advanced Directives 01/22/2021 09/26/2014  Does Patient Have a Medical Advance Directive? Yes No  Type of Paramedic of [place];Living will -  Copy of [place] in Chart? No - copy requested -  Would patient like information on creating a medical advance directive? - No - patient declined information    Current Medications (verified) Outpatient Encounter Medications as of 01/22/2021  Medication Sig   atorvastatin (LIPITOR) 20 MG tablet TAKE 1 TABLET BY MOUTH  DAILY   lisinopril (ZESTRIL) 10 MG tablet TAKE 1 TABLET BY MOUTH  DAILY   [DISCONTINUED] sulfamethoxazole-trimethoprim (BACTRIM DS) 800-160 MG tablet Take 1 tablet by mouth 2 (two) times  daily.   No facility-administered encounter medications on file as of 01/22/2021.    Allergies (verified) Patient has no known allergies.   History: Past Medical History:  Diagnosis Date   Colon polyps    Diabetes mellitus without complication ([place])    Diverticulitis    Hypertension    Past Surgical History:  Procedure Laterality Date   APPENDECTOMY     FRACTURE SURGERY     Family History  Problem Relation Age of Onset   Cancer Father 4       Lung Cancer   Social History   Socioeconomic History   Marital status: Married    Spouse name: Not on file   Number of children: Not on file   Years of education: Not on file   Highest education level: Not on file  Occupational History   Not on file  Tobacco Use   Smoking status: Former    Types: Cigarettes    Quit date: 02/21/1998    Years since quitting: 22.9   Smokeless tobacco: Never  Substance and Sexual Activity   Alcohol use: Yes   Drug use: No   Sexual activity: Not on file  Other Topics Concern   Not on file  Social History Narrative   Not on file   Social Determinants of Health   Financial Resource Strain: Low Risk    Difficulty of Paying Living Expenses: Not hard at all  Food Insecurity: No Food Insecurity   Worried About [place] in the Last Year: Never true   Ran Out of  Food in the Last Year: Never true  Transportation Needs: No Transportation Needs   Lack of Transportation (Medical): No   Lack of Transportation (Non-Medical): No  Physical Activity: Sufficiently Active   Days of Exercise per Week: 5 days   Minutes of Exercise per Session: 30 min  Stress: No Stress Concern Present   Feeling of Stress : Not at all  Social Connections: Moderately Isolated   Frequency of Communication with Friends and Family: More than three times a week   Frequency of Social Gatherings with Friends and Family: More than three times a week   Attends Religious Services: Never   Marine scientist or  Organizations: No   Attends Music therapist: Never   Marital Status: Married    Tobacco Counseling Counseling given: Not Answered   Clinical Intake:  Pre-visit preparation completed: Yes  Pain : No/denies pain     BMI - recorded: 39.47 Nutritional Status: BMI > 30  Obese Nutritional Risks: None Diabetes: No  How often do you need to have someone help you when you read instructions, pamphlets, or other written materials from your doctor or pharmacy?: 1 - Never  Diabetic?no  Interpreter Needed?: No  Information entered by :: MJ Shalay Carder, LPN   Activities of Daily Living In your present state of health, do you have any difficulty performing the following activities: 01/22/2021  Hearing? N  Vision? Y  Difficulty concentrating or making decisions? N  Comment Remembering  Walking or climbing stairs? N  Dressing or bathing? N  Doing errands, shopping? N  Preparing Food and eating ? N  Using the Toilet? N  In the past six months, have you accidently leaked urine? N  Do you have problems with loss of bowel control? N  Managing your Medications? N  Managing your Finances? N  Housekeeping or managing your Housekeeping? N  Some recent data might be hidden    Patient Care Team: Susy Frizzle, MD as PCP - General (Family Medicine)  Indicate any recent Medical Services you may have received from other than [facility] providers in the past year (date may be approximate).     Assessment:   This is a routine wellness examination for [place].  Hearing/Vision screen Hearing Screening - Comments:: No hearing issues.  Vision Screening - Comments:: Glasses. Dr. Sabra Heck. 2021 .  Dietary issues and exercise activities discussed: Current Exercise Habits: The patient has a physically strenuous job, but has no regular exercise apart from work., Exercise limited by: cardiac condition(s)   Goals Addressed             This Visit's Progress    DIET - [place]       Pt would like to lose weight.        Depression Screen PHQ 2/9 Scores 01/22/2021 04/11/2020 07/13/2017  PHQ - 2 Score 0 0 0    Fall Risk Fall Risk  01/22/2021 04/11/2020 07/13/2017  Falls in the past year? 0 0 No  Number falls in past yr: 0 - -  Injury with Fall? 0 - -  Risk for fall due to : No Fall Risks No Fall Risks -  Follow up Falls prevention discussed Falls evaluation completed -    FALL RISK PREVENTION PERTAINING TO THE HOME:  Any stairs in or around the home? No  If so, are there any without handrails? No  Home free of loose throw rugs in walkways, pet beds, electrical cords, etc? Yes  Adequate  lighting in your home to reduce risk of falls? Yes   ASSISTIVE DEVICES UTILIZED TO PREVENT FALLS:  Life alert? No  Use of a cane, walker or w/c? No  Grab bars in the bathroom? No  Shower chair or bench in shower? No  Elevated toilet seat or a handicapped toilet? No   TIMED UP AND GO:  Was the test performed? No .    Cognitive Function:     6CIT Screen 01/22/2021  What Year? 0 points  What month? 0 points  What time? 0 points  Count back from 20 0 points  Months in reverse 4 points  Repeat phrase 0 points  Total Score 4    Immunizations Immunization History  Administered Date(s) Administered   Influenza, High Dose Seasonal PF 03/18/2017, 04/21/2018   Influenza,inj,Quad PF,6+ Mos 03/01/2013, 02/02/2016, 04/11/2020   Moderna Sars-Covid-2 Vaccination 05/03/2019, 05/31/2019, 02/25/2020   Pneumococcal Conjugate-13 03/18/2017   Pneumococcal Polysaccharide-23 08/08/2001, 02/02/2016   Td 11/27/1993, 06/26/2009   Tdap 09/26/2014    TDAP status: Up to date  Flu Vaccine status: Up to date  Pneumococcal vaccine status: Up to date  Covid-19 vaccine status: Information provided on how to obtain vaccines.   Qualifies for Shingles Vaccine? Yes   Zostavax completed No   Shingrix Completed?: No.    Education has been provided regarding the importance of  this vaccine. Patient has been advised to call insurance company to determine out of pocket expense if they have not yet received this vaccine. Advised may also receive vaccine at local pharmacy or Health Dept. Verbalized acceptance and understanding.  Screening Tests Health Maintenance  Topic Date Due   Hepatitis C Screening  Never done   Zoster Vaccines- Shingrix (1 of 2) Never done   COLONOSCOPY (Pts 45-30yrs Insurance coverage will need to be confirmed)  04/06/2020   COVID-19 Vaccine (4 - Booster for Moderna series) 04/21/2020   INFLUENZA VACCINE  10/20/2020   TETANUS/TDAP  09/25/2024   Pneumonia Vaccine 83+ Years old  Completed   HPV VACCINES  Aged Out    Health Maintenance  Health Maintenance Due  Topic Date Due   Hepatitis C Screening  Never done   Zoster Vaccines- Shingrix (1 of 2) Never done   COLONOSCOPY (Pts 45-32yrs Insurance coverage will need to be confirmed)  04/06/2020   COVID-19 Vaccine (4 - Booster for Moderna series) 04/21/2020   INFLUENZA VACCINE  10/20/2020    Colorectal cancer screening: Referral to GI placed Cologuard ordered today. Pt aware the office will call re: appt.  Lung Cancer Screening: (Low Dose CT Chest recommended if Age 71-80 years, 30 pack-year currently smoking OR have quit w/in 15years.) does not qualify.    Additional Screening:  Hepatitis C Screening: does qualify; Completed Not done.  Vision Screening: Recommended annual ophthalmology exams for early detection of glaucoma and other disorders of the eye. Is the patient up to date with their annual eye exam?  Yes  Who is the provider or what is the name of the office in which the patient attends annual eye exams? Dr. Sabra Heck If pt is not established with a provider, would they like to be referred to a provider to establish care? No .   Dental Screening: Recommended annual dental exams for proper oral hygiene  Community Resource Referral / Chronic Care Management: CRR required this  visit?  No   CCM required this visit?  No      Plan:     I have personally reviewed and  noted the following in the patient's chart:   Medical and social history Use of alcohol, tobacco or illicit drugs  Current medications and supplements including opioid prescriptions. Patient is not currently taking opioid prescriptions. Functional ability and status Nutritional status Physical activity Advanced directives List of other physicians Hospitalizations, surgeries, and ER visits in previous 12 months Vitals Screenings to include cognitive, depression, and falls Referrals and appointments  In addition, I have reviewed and discussed with patient certain preventive protocols, quality metrics, and best practice recommendations. A written personalized care plan for preventive services as well as general preventive health recommendations were provided to patient.     Chriss Driver, LPN   01/23/6430   Nurse Notes: Colonoscopy due. Pt requests to do Cologuard instead. Order placed. Discussed Shingles.

## 2021-01-22 NOTE — Patient Instructions (Signed)
Keith Day , Thank you for taking time to come for your Medicare Wellness Visit. I appreciate your ongoing commitment to your health goals. Please review the following plan we discussed and let me know if I can assist you in the future.   Screening recommendations/referrals: Colonoscopy: Order placed for Cologuard today.  Recommended yearly ophthalmology/optometry visit for glaucoma screening and checkup Recommended yearly dental visit for hygiene and checkup  Vaccinations: Influenza vaccine: Done 04/11/2020. Due yearly.  Pneumococcal vaccine: Done 08/08/2001, 02/02/2016 and 02/20/2017 Tdap vaccine: Done 09/26/2014 Repeat in 10 years  Shingles vaccine: Shingrix discussed. Please contact your pharmacy for coverage information.     Covid-19: Done 05/03/19, 05/31/19 and 02/25/20.  Advanced directives: Please bring a copy of your health care power of attorney and living will to the office to be added to your chart at your convenience.   Conditions/risks identified: Aim for 30 minutes of exercise or brisk walking each day, drink 6-8 glasses of water and eat lots of fruits and vegetables.   Next appointment: Follow up in one year for your annual wellness visit. 2023.  Preventive Care 21 Years and Older, Male  Preventive care refers to lifestyle choices and visits with your health care provider that can promote health and wellness. What does preventive care include? A yearly physical exam. This is also called an annual well check. Dental exams once or twice a year. Routine eye exams. Ask your health care provider how often you should have your eyes checked. Personal lifestyle choices, including: Daily care of your teeth and gums. Regular physical activity. Eating a healthy diet. Avoiding tobacco and drug use. Limiting alcohol use. Practicing safe sex. Taking low doses of aspirin every day. Taking vitamin and mineral supplements as recommended by your health care provider. What happens  during an annual well check? The services and screenings done by your health care provider during your annual well check will depend on your age, overall health, lifestyle risk factors, and family history of disease. Counseling  Your health care provider may ask you questions about your: Alcohol use. Tobacco use. Drug use. Emotional well-being. Home and relationship well-being. Sexual activity. Eating habits. History of falls. Memory and ability to understand (cognition). Work and work Statistician. Screening  You may have the following tests or measurements: Height, weight, and BMI. Blood pressure. Lipid and cholesterol levels. These may be checked every 5 years, or more frequently if you are over 71 years old. Skin check. Lung cancer screening. You may have this screening every year starting at age 71 if you have a 30-pack-year history of smoking and currently smoke or have quit within the past 15 years. Fecal occult blood test (FOBT) of the stool. You may have this test every year starting at age 45. Flexible sigmoidoscopy or colonoscopy. You may have a sigmoidoscopy every 5 years or a colonoscopy every 10 years starting at age 71. Prostate cancer screening. Recommendations will vary depending on your family history and other risks. Hepatitis C blood test. Hepatitis B blood test. Sexually transmitted disease (STD) testing. Diabetes screening. This is done by checking your blood sugar (glucose) after you have not eaten for a while (fasting). You may have this done every 1-3 years. Abdominal aortic aneurysm (AAA) screening. You may need this if you are a current or former smoker. Osteoporosis. You may be screened starting at age 71 if you are at high risk. Talk with your health care provider about your test results, treatment options, and if necessary, the need  for more tests. Vaccines  Your health care provider may recommend certain vaccines, such as: Influenza vaccine. This is  recommended every year. Tetanus, diphtheria, and acellular pertussis (Tdap, Td) vaccine. You may need a Td booster every 10 years. Zoster vaccine. You may need this after age 71. Pneumococcal 13-valent conjugate (PCV13) vaccine. One dose is recommended after age 71. Pneumococcal polysaccharide (PPSV23) vaccine. One dose is recommended after age 71. Talk to your health care provider about which screenings and vaccines you need and how often you need them. This information is not intended to replace advice given to you by your health care provider. Make sure you discuss any questions you have with your health care provider. Document Released: 04/04/2015 Document Revised: 11/26/2015 Document Reviewed: 01/07/2015 Elsevier Interactive Patient Education  2017 [place] Prevention in the Home Falls can cause injuries. They can happen to people of all ages. There are many things you can do to make your home safe and to help prevent falls. What can I do on the outside of my home? Regularly fix the edges of walkways and driveways and fix any cracks. Remove anything that might make you trip as you walk through a door, such as a raised step or threshold. Trim any bushes or trees on the path to your home. Use bright outdoor lighting. Clear any walking paths of anything that might make someone trip, such as rocks or tools. Regularly check to see if handrails are loose or broken. Make sure that both sides of any steps have handrails. Any raised decks and porches should have guardrails on the edges. Have any leaves, snow, or ice cleared regularly. Use sand or salt on walking paths during winter. Clean up any spills in your garage right away. This includes oil or grease spills. What can I do in the bathroom? Use night lights. Install grab bars by the toilet and in the tub and shower. Do not use towel bars as grab bars. Use non-skid mats or decals in the tub or shower. If you need to sit down in  the shower, use a plastic, non-slip stool. Keep the floor dry. Clean up any water that spills on the floor as soon as it happens. Remove soap buildup in the tub or shower regularly. Attach bath mats securely with double-sided non-slip rug tape. Do not have throw rugs and other things on the floor that can make you trip. What can I do in the bedroom? Use night lights. Make sure that you have a light by your bed that is easy to reach. Do not use any sheets or blankets that are too big for your bed. They should not hang down onto the floor. Have a firm chair that has side arms. You can use this for support while you get dressed. Do not have throw rugs and other things on the floor that can make you trip. What can I do in the kitchen? Clean up any spills right away. Avoid walking on wet floors. Keep items that you use a lot in easy-to-reach places. If you need to reach something above you, use a strong step stool that has a grab bar. Keep electrical cords out of the way. Do not use floor polish or wax that makes floors slippery. If you must use wax, use non-skid floor wax. Do not have throw rugs and other things on the floor that can make you trip. What can I do with my stairs? Do not leave any items on the stairs.  Make sure that there are handrails on both sides of the stairs and use them. Fix handrails that are broken or loose. Make sure that handrails are as long as the stairways. Check any carpeting to make sure that it is firmly attached to the stairs. Fix any carpet that is loose or worn. Avoid having throw rugs at the top or bottom of the stairs. If you do have throw rugs, attach them to the floor with carpet tape. Make sure that you have a light switch at the top of the stairs and the bottom of the stairs. If you do not have them, ask someone to add them for you. What else can I do to help prevent falls? Wear shoes that: Do not have high heels. Have rubber bottoms. Are comfortable  and fit you well. Are closed at the toe. Do not wear sandals. If you use a stepladder: Make sure that it is fully opened. Do not climb a closed stepladder. Make sure that both sides of the stepladder are locked into place. Ask someone to hold it for you, if possible. Clearly mark and make sure that you can see: Any grab bars or handrails. First and last steps. Where the edge of each step is. Use tools that help you move around (mobility aids) if they are needed. These include: Canes. Walkers. Scooters. Crutches. Turn on the lights when you go into a dark area. Replace any light bulbs as soon as they burn out. Set up your furniture so you have a clear path. Avoid moving your furniture around. If any of your floors are uneven, fix them. If there are any pets around you, be aware of where they are. Review your medicines with your doctor. Some medicines can make you feel dizzy. This can increase your chance of falling. Ask your doctor what other things that you can do to help prevent falls. This information is not intended to replace advice given to you by your health care provider. Make sure you discuss any questions you have with your health care provider. Document Released: 01/02/2009 Document Revised: 08/14/2015 Document Reviewed: 04/12/2014 Elsevier Interactive Patient Education  2017 [employer].

## 2021-02-10 DIAGNOSIS — G4733 Obstructive sleep apnea (adult) (pediatric): Secondary | ICD-10-CM | POA: Diagnosis not present

## 2021-03-02 ENCOUNTER — Encounter: Payer: Self-pay | Admitting: Neurology

## 2021-03-02 NOTE — Patient Instructions (Addendum)
Please continue using your CPAP regularly. While your insurance requires that you use CPAP at least 4 hours each night on 70% of the nights, I recommend, that you not skip any nights and use it throughout the night if you can. Getting used to CPAP and staying with the treatment long term does take time and patience and discipline. Untreated obstructive sleep apnea when it is moderate to severe can have an adverse impact on cardiovascular health and raise her risk for heart disease, arrhythmias, hypertension, congestive heart failure, stroke and diabetes. Untreated obstructive sleep apnea causes sleep disruption, nonrestorative sleep, and sleep deprivation. This can have an impact on your day to day functioning and cause daytime sleepiness and impairment of cognitive function, memory loss, mood disturbance, and problems focussing. Using CPAP regularly can improve these symptoms.   We will send you for a mask refitting. Please focus on small goals. You can use during the day to help you get used to it.   Follow up in 3-4 months   DME: [facility] Phone: [phone], press option 1 Fax: [phone]

## 2021-03-02 NOTE — Progress Notes (Signed)
PATIENT: Keith Day DOB: 01-15-1951  REASON FOR VISIT: follow up HISTORY FROM: patient  Chief Complaint  Patient presents with   Follow-up    Pt alone, rm  11. Overall states that the machine is not working well. He states that he doesn't feel the mask is right for him. DME: [facility]      HISTORY OF PRESENT ILLNESS:  03/03/21 ALL:  Keith Day is a 70 y.o. male here today for follow up for OSA on CPAP.  He continues to struggle with tolerating CPAP. He has used it a couple of times for about an hour but admits that he has not had any significant usage to evaluate. He is not sure he can continue CPAP. He requested a new nasal mask but did not receive from [place]. He did not call to check. He is using a FFM now and feels that he fights with it. He is willing to continue trying.   HISTORY: (copied from Dr Guadelupe Sabin previous note)  Keith Day is a 70 year old right-handed gentleman with an underlying medical history of diabetes, hypertension, diverticulitis, and obesity, who presents for follow-up consultation of his sleep disturbance, including obstructive sleep apnea.  The patient is unaccompanied today.  I first met him at the request of his primary care physician on 05/12/2020, at which time he reported snoring and difficulty maintaining sleep.  He was advised to proceed with a sleep study.  He had a baseline sleep study on 06/13/2020, which showed a sleep latency of 5.5 minutes, sleep efficiency 76.8%, REM latency 139 minutes.  He had a total AHI of 27.9/h, REM AHI 28.6/h, supine AHI 68.3/h.  Average oxygen saturation was 93%, nadir was 66%.  Time below 89% saturation was 61 minutes.  He was advised to return for a titration study.  He had an attempt at CPAP titration on 07/08/2020 but did not achieve any sleep.  He was advised to consider AutoPap therapy.  He requested a follow-up appointment to discuss further.   Today, 09/23/2020: He reports no new symptoms.  He reports  that he could not tolerate the air at the time of his CPAP titration.  He is not sure if he is going to be able to use the machine but is willing to try.  REVIEW OF SYSTEMS: Out of a complete 14 system review of symptoms, the patient complains only of the following symptoms, wheezing and all other reviewed systems are negative.   ALLERGIES: No Known Allergies  HOME MEDICATIONS: Outpatient Medications Prior to Visit  Medication Sig Dispense Refill   atorvastatin (LIPITOR) 20 MG tablet TAKE 1 TABLET BY MOUTH  DAILY 30 tablet 11   lisinopril (ZESTRIL) 10 MG tablet TAKE 1 TABLET BY MOUTH  DAILY 30 tablet 11   No facility-administered medications prior to visit.    PAST MEDICAL HISTORY: Past Medical History:  Diagnosis Date   Colon polyps    Diabetes mellitus without complication ([place])    Diverticulitis    Hypertension     PAST SURGICAL HISTORY: Past Surgical History:  Procedure Laterality Date   APPENDECTOMY     FRACTURE SURGERY      FAMILY HISTORY: Family History  Problem Relation Age of Onset   Cancer Father 61       Lung Cancer    SOCIAL HISTORY: Social History   Socioeconomic History   Marital status: Married    Spouse name: Not on file   Number of children: Not on file  Years of education: Not on file   Highest education level: Not on file  Occupational History   Not on file  Tobacco Use   Smoking status: Former    Types: Cigarettes    Quit date: 02/21/1998    Years since quitting: 23.0   Smokeless tobacco: Never  Substance and Sexual Activity   Alcohol use: Yes   Drug use: No   Sexual activity: Not on file  Other Topics Concern   Not on file  Social History Narrative   Not on file   Social Determinants of Health   Financial Resource Strain: Low Risk    Difficulty of Paying Living Expenses: Not hard at all  Food Insecurity: No Food Insecurity   Worried About Charity fundraiser in the Last Year: Never true   [place] in the Last Year:  Never true  Transportation Needs: No Transportation Needs   Lack of Transportation (Medical): No   Lack of Transportation (Non-Medical): No  Physical Activity: Sufficiently Active   Days of Exercise per Week: 5 days   Minutes of Exercise per Session: 30 min  Stress: No Stress Concern Present   Feeling of Stress : Not at all  Social Connections: Moderately Isolated   Frequency of Communication with Friends and Family: More than three times a week   Frequency of Social Gatherings with Friends and Family: More than three times a week   Attends Religious Services: Never   Marine scientist or Organizations: No   Attends Archivist Meetings: Never   Marital Status: Married  Human resources officer Violence: Not At Risk   Fear of Current or Ex-Partner: No   Emotionally Abused: No   Physically Abused: No   Sexually Abused: No     PHYSICAL EXAM  Vitals:   03/03/21 1122  BP: 128/72  Pulse: 83  Weight: 275 lb (124.7 kg)  Height: 5' 11"  (1.803 m)   Body mass index is 38.35 kg/m.  Generalized: Well developed, in no acute distress  Cardiology: normal rate and rhythm, no murmur noted Respiratory: clear to auscultation bilaterally  Neurological examination  Mentation: Alert oriented to time, place, history taking. Follows all commands speech and language fluent Cranial nerve II-XII: Pupils were equal round reactive to light. Extraocular movements were full, visual field were full  Motor: The motor testing reveals 5 over 5 strength of all 4 extremities. Good symmetric motor tone is noted throughout.  Gait and station: Gait is normal.    DIAGNOSTIC DATA (LABS, IMAGING, TESTING) - I reviewed patient records, labs, notes, testing and imaging myself where available.  No flowsheet data found.   Lab Results  Component Value Date   WBC 8.7 06/05/2015   HGB 16.0 06/05/2015   HCT 46.5 06/05/2015   MCV 87.1 06/05/2015   PLT 221 06/05/2015      Component Value Date/Time   NA  140 07/13/2017 0822   K 4.7 07/13/2017 0822   CL 105 07/13/2017 0822   CO2 26 07/13/2017 0822   GLUCOSE 102 (H) 07/13/2017 0822   BUN 16 07/13/2017 0822   CREATININE 1.23 07/13/2017 0822   CALCIUM 9.4 07/13/2017 0822   PROT 6.9 07/13/2017 0822   ALBUMIN 4.5 06/05/2015 1247   AST 27 07/13/2017 0822   ALT 29 07/13/2017 0822   ALKPHOS 63 06/05/2015 1247   BILITOT 0.7 07/13/2017 0822   GFRNONAA 60 07/13/2017 0822   GFRAA 70 07/13/2017 0822   Lab Results  Component Value  Date   CHOL 136 07/13/2017   HDL 45 07/13/2017   LDLCALC 70 07/13/2017   TRIG 128 07/13/2017   CHOLHDL 3.0 07/13/2017   No results found for: HGBA1C No results found for: VITAMINB12 Lab Results  Component Value Date   TSH 0.98 07/13/2017     ASSESSMENT AND PLAN 70 y.o. year old male  has a past medical history of Colon polyps, Diabetes mellitus without complication ([place]), Diverticulitis, and Hypertension. here with     ICD-10-CM   1. OSA (obstructive sleep apnea)  G47.33 For home use only DME continuous positive airway pressure (CPAP)        METRO EDENFIELD is having difficulty tolerating CPAP therapy. Compliance report unavailable but he admits he has had very little usage since last visit. We have discussed sleep study results. He does wish to give it one more try. I have sent mask refitting orders and advised that he contact DME. Contact information provided. We have discussed using machine while awake to help him become more comfortable with using CPAP. He was encouraged to shoot for using CPAP nightly and for greater than 4 hours each night. He will start with small goals and build. We will update supply orders as indicated. Risks of untreated sleep apnea review and education materials provided. Healthy lifestyle habits encouraged. He will follow up in 3-4 months, sooner if needed. He verbalizes understanding and agreement with this plan.    Orders Placed This Encounter  Procedures   For home use only DME  continuous positive airway pressure (CPAP)    Mask refitting and reeducation, having a hard time tolerating machine.    Order Specific Question:   Length of Need    Answer:   Lifetime    Order Specific Question:   Patient has OSA or probable OSA    Answer:   Yes    Order Specific Question:   Is the patient currently using CPAP in the home    Answer:   Yes    Order Specific Question:   Settings    Answer:   Other see comments    Order Specific Question:   CPAP supplies needed    Answer:   Mask, headgear, cushions, filters, heated tubing and water chamber      No orders of the defined types were placed in this encounter.     Debbora Presto, FNP-C 03/03/2021, 11:49 AM [facility] [address], [place], [place] [address] [phone]

## 2021-03-03 ENCOUNTER — Encounter: Payer: Self-pay | Admitting: Family Medicine

## 2021-03-03 ENCOUNTER — Ambulatory Visit: Payer: Medicare Other | Admitting: Family Medicine

## 2021-03-03 VITALS — BP 128/72 | HR 83 | Ht 71.0 in | Wt 275.0 lb

## 2021-03-03 DIAGNOSIS — G4733 Obstructive sleep apnea (adult) (pediatric): Secondary | ICD-10-CM

## 2021-03-08 ENCOUNTER — Other Ambulatory Visit: Payer: Self-pay | Admitting: Family Medicine

## 2021-04-12 DIAGNOSIS — G4733 Obstructive sleep apnea (adult) (pediatric): Secondary | ICD-10-CM | POA: Diagnosis not present

## 2021-05-13 DIAGNOSIS — G4733 Obstructive sleep apnea (adult) (pediatric): Secondary | ICD-10-CM | POA: Diagnosis not present

## 2021-06-10 DIAGNOSIS — G4733 Obstructive sleep apnea (adult) (pediatric): Secondary | ICD-10-CM | POA: Diagnosis not present

## 2021-06-20 DIAGNOSIS — R051 Acute cough: Secondary | ICD-10-CM | POA: Diagnosis not present

## 2021-06-20 DIAGNOSIS — Z20822 Contact with and (suspected) exposure to covid-19: Secondary | ICD-10-CM | POA: Diagnosis not present

## 2021-06-20 DIAGNOSIS — R5381 Other malaise: Secondary | ICD-10-CM | POA: Diagnosis not present

## 2021-06-20 DIAGNOSIS — R5383 Other fatigue: Secondary | ICD-10-CM | POA: Diagnosis not present

## 2021-06-20 DIAGNOSIS — J069 Acute upper respiratory infection, unspecified: Secondary | ICD-10-CM | POA: Diagnosis not present

## 2021-07-08 NOTE — Progress Notes (Signed)
? ? ?PATIENT: Keith Day ?DOB: 1950/12/17 ? ?REASON FOR VISIT: follow up ?HISTORY FROM: patient ? ?Chief Complaint  ?Patient presents with  ? Obstructive Sleep Apnea  ?  Rm 1, alone. Here for CPAP f/u. Pt has not used CPAP in over a month. He did not bring his machine or SD card. 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   ?  ? ?HISTORY OF PRESENT ILLNESS: ? ?07/09/21 ALL:  ?[name] returns for follow up for OSA on CPAP. He was last seen 02/2021 and having difficulty with compliance. He tells me, today, that he has not resumed therapy. He just doesn't like to use CPAP and was not sure he really needed it. 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 He was not aware he had to show consistent use of CPAP.  ? ?03/03/2021 ALL:  ?DARALD Day is a 71 y.o. male here today for follow up for OSA on CPAP.  He continues to struggle with tolerating CPAP. He has used it a couple of times for about an hour but admits that he has not had any significant usage to evaluate. He is not sure he can continue CPAP. He requested a new nasal mask but did not receive from [facility]. He did not call to check. He is using a FFM now and feels that he fights with it. He is willing to continue trying.  ? ?HISTORY: (copied from Dr Guadelupe Sabin previous note) ? ?Keith Day is a 71 year old right-handed gentleman with an underlying medical history of diabetes, hypertension, diverticulitis, and obesity, who presents for follow-up consultation of his sleep disturbance, including obstructive sleep apnea.  The patient is unaccompanied today.  I first met him at the request of his primary care physician on 05/12/2020, at which time he reported snoring and difficulty maintaining sleep.  He was advised to proceed with a sleep study.  He had a baseline sleep study on 06/13/2020, which showed a sleep latency of 5.5 minutes, sleep efficiency 76.8%, REM latency 139 minutes.  He had a total AHI of 27.9/h, REM AHI 28.6/h,  supine AHI 68.3/h.  Average oxygen saturation was 93%, nadir was 66%.  Time below 89% saturation was 61 minutes.  He was advised to return for a titration study.  He had an attempt at CPAP titration on 07/08/2020 but did not achieve any sleep.  He was advised to consider AutoPap therapy.  He requested a follow-up appointment to discuss further. ?  ?Today, 09/23/2020: He reports no new symptoms.  He reports that he could not tolerate the air at the time of his CPAP titration.  He is not sure if he is going to be able to use the machine but is willing to try. ? ?REVIEW OF SYSTEMS: Out of a complete 14 system review of symptoms, the patient complains only of the following symptoms, wheezing and all other reviewed systems are negative. ? ?ESS: 8 ? ?ALLERGIES: ?No Known Allergies ? ?HOME MEDICATIONS: ?Outpatient Medications Prior to Visit  ?Medication Sig Dispense Refill  ? atorvastatin (LIPITOR) 20 MG tablet TAKE 1 TABLET BY MOUTH  DAILY 90 tablet 3  ? lisinopril (ZESTRIL) 10 MG tablet TAKE 1 TABLET BY MOUTH  DAILY 90 tablet 3  ? ?No facility-administered medications prior to visit.  ? ? ?PAST MEDICAL HISTORY: ?Past Medical History:  ?Diagnosis Date  ? Colon polyps   ? Diabetes mellitus without complication ([place])   ? Diverticulitis   ?  Hypertension   ? ? ?PAST SURGICAL HISTORY: ?Past Surgical History:  ?Procedure Laterality Date  ? APPENDECTOMY    ? FRACTURE SURGERY    ? ? ?FAMILY HISTORY: ?Family History  ?Problem Relation Age of Onset  ? Cancer Father 38  ?     Lung Cancer  ? ? ?SOCIAL HISTORY: ?Social History  ? ?Socioeconomic History  ? Marital status: Married  ?  Spouse name: Not on file  ? Number of children: Not on file  ? Years of education: Not on file  ? Highest education level: Not on file  ?Occupational History  ? Not on file  ?Tobacco Use  ? Smoking status: Former  ?  Types: Cigarettes  ?  Quit date: 02/21/1998  ?  Years since quitting: 23.3  ? Smokeless tobacco: Never  ?Substance and Sexual Activity  ? Alcohol  use: Yes  ? Drug use: No  ? Sexual activity: Not on file  ?Other Topics Concern  ? Not on file  ?Social History Narrative  ? Not on file  ? ?Social Determinants of Health  ? ?Financial Resource Strain: Low Risk   ? Difficulty of Paying Living Expenses: Not hard at all  ?Food Insecurity: No Food Insecurity  ? Worried About Charity fundraiser in the Last Year: Never true  ? Ran Out of Food in the Last Year: Never true  ?Transportation Needs: No Transportation Needs  ? Lack of Transportation (Medical): No  ? Lack of Transportation (Non-Medical): No  ?Physical Activity: Sufficiently Active  ? Days of Exercise per Week: 5 days  ? Minutes of Exercise per Session: 30 min  ?Stress: No Stress Concern Present  ? Feeling of Stress : Not at all  ?Social Connections: Moderately Isolated  ? Frequency of Communication with Friends and Family: More than three times a week  ? Frequency of Social Gatherings with Friends and Family: More than three times a week  ? Attends Religious Services: Never  ? Active Member of Clubs or Organizations: No  ? Attends Archivist Meetings: Never  ? Marital Status: Married  ?Intimate Partner Violence: Not At Risk  ? Fear of Current or Ex-Partner: No  ? Emotionally Abused: No  ? Physically Abused: No  ? Sexually Abused: No  ? ? ? ?PHYSICAL EXAM ? ?Vitals:  ? 07/09/21 0736  ?BP: (!) 154/88  ?Pulse: 72  ?Weight: 274 lb (124.3 kg)  ?Height: 5' 11"  (1.803 m)  ? ? ?Body mass index is 38.22 kg/m?. ? ?Generalized: Well developed, in no acute distress  ?Cardiology: normal rate and rhythm, no murmur noted ?Respiratory: clear to auscultation bilaterally  ?Neurological examination  ?Mentation: Alert oriented to time, place, history taking. Follows all commands speech and language fluent ?Cranial nerve II-XII: Pupils were equal round reactive to light. Extraocular movements were full, visual field were full  ?Motor: The motor testing reveals 5 over 5 strength of all 4 extremities. Good symmetric motor  tone is noted throughout.  ?Gait and station: Gait is normal.  ? ? ?DIAGNOSTIC DATA (LABS, IMAGING, TESTING) ?- I reviewed patient records, labs, notes, testing and imaging myself where available. ? ?   ? View : No data to display.  ?  ?  ?  ?  ? ?Lab Results  ?Component Value Date  ? WBC 8.7 06/05/2015  ? HGB 16.0 06/05/2015  ? HCT 46.5 06/05/2015  ? MCV 87.1 06/05/2015  ? PLT 221 06/05/2015  ? ?   ?Component Value Date/Time  ?  NA 140 07/13/2017 0822  ? K 4.7 07/13/2017 0822  ? CL 105 07/13/2017 0822  ? CO2 26 07/13/2017 0822  ? GLUCOSE 102 (H) 07/13/2017 8257  ? BUN 16 07/13/2017 0822  ? CREATININE 1.23 07/13/2017 0822  ? CALCIUM 9.4 07/13/2017 0822  ? PROT 6.9 07/13/2017 0822  ? ALBUMIN 4.5 06/05/2015 1247  ? AST 27 07/13/2017 0822  ? ALT 29 07/13/2017 0822  ? ALKPHOS 63 06/05/2015 1247  ? BILITOT 0.7 07/13/2017 0822  ? GFRNONAA 60 07/13/2017 0822  ? GFRAA 70 07/13/2017 0822  ? ?Lab Results  ?Component Value Date  ? CHOL 136 07/13/2017  ? HDL 45 07/13/2017  ? [place] 70 07/13/2017  ? TRIG 128 07/13/2017  ? CHOLHDL 3.0 07/13/2017  ? ?No results found for: HGBA1C ?No results found for: VITAMINB12 ?Lab Results  ?Component Value Date  ? TSH 0.98 07/13/2017  ? ? ? ?ASSESSMENT AND PLAN ?71 y.o. year old male  has a past medical history of Colon polyps, Diabetes mellitus without complication ([place]), Diverticulitis, and Hypertension. here with  ? ?  ICD-10-CM   ?1. OSA (obstructive sleep apnea)  G47.33   ?  ? ? ? ?KAIROS PANETTA is having difficulty tolerating CPAP therapy. He has not used therapy since last visit. We have discussed sleep study results. He does recognize need for therapy. He is due for DOT physical and understands need for compliance. We have discussed using machine while awake to help him become more comfortable with using CPAP. He was encouraged to shoot for using CPAP nightly and for greater than 4 hours each night. He will start with small goals and build. Risks of untreated sleep apnea review and  education materials provided. Healthy lifestyle habits encouraged. He will follow up in 3-4 months, sooner if needed. He verbalizes understanding and agreement with this plan.  ? ? ?No orders of the defin

## 2021-07-09 ENCOUNTER — Encounter: Payer: Self-pay | Admitting: Family Medicine

## 2021-07-09 ENCOUNTER — Ambulatory Visit: Payer: Medicare Other | Admitting: Family Medicine

## 2021-07-09 VITALS — BP 154/88 | HR 72 | Ht 71.0 in | Wt 274.0 lb

## 2021-07-09 DIAGNOSIS — G4733 Obstructive sleep apnea (adult) (pediatric): Secondary | ICD-10-CM | POA: Diagnosis not present

## 2021-07-09 NOTE — Patient Instructions (Addendum)
Please continue using your CPAP regularly. While your insurance requires that you use CPAP at least 4 hours each night on 70% of the nights, I recommend, that you not skip any nights and use it throughout the night if you can. Getting used to CPAP and staying with the treatment long term does take time and patience and discipline. Untreated obstructive sleep apnea when it is moderate to severe can have an adverse impact on cardiovascular health and raise her risk for heart disease, arrhythmias, hypertension, congestive heart failure, stroke and diabetes. Untreated obstructive sleep apnea causes sleep disruption, nonrestorative sleep, and sleep deprivation. This can have an impact on your day to day functioning and cause daytime sleepiness and impairment of cognitive function, memory loss, mood disturbance, and problems focussing. Using CPAP regularly can improve these symptoms. ? ?Please resume CPAP therapy asap. Try melatonin over the counter if you have trouble sleeping. I recommend ZyQuil. You can get this at any pharmacy  ? ?DME: [facility] ?Phone: [phone], press option 1 ? ?Follow up with me in 4-6 months  ?

## 2021-07-11 DIAGNOSIS — G4733 Obstructive sleep apnea (adult) (pediatric): Secondary | ICD-10-CM | POA: Diagnosis not present

## 2021-07-17 DIAGNOSIS — G4733 Obstructive sleep apnea (adult) (pediatric): Secondary | ICD-10-CM | POA: Diagnosis not present

## 2021-08-10 DIAGNOSIS — G4733 Obstructive sleep apnea (adult) (pediatric): Secondary | ICD-10-CM | POA: Diagnosis not present

## 2021-09-10 ENCOUNTER — Telehealth: Payer: Self-pay | Admitting: *Deleted

## 2021-09-10 NOTE — Telephone Encounter (Signed)
Pt stopped by to get last 30 days of data from CPAP ([facility]) for DOT physical. Provided him below copy.

## 2021-10-20 ENCOUNTER — Telehealth: Payer: Self-pay | Admitting: Family Medicine

## 2021-10-20 NOTE — Telephone Encounter (Signed)
Left mychart msg informing pt of appointment change due to provider template change

## 2021-11-05 DIAGNOSIS — G4733 Obstructive sleep apnea (adult) (pediatric): Secondary | ICD-10-CM | POA: Diagnosis not present

## 2021-12-01 ENCOUNTER — Other Ambulatory Visit: Payer: Self-pay | Admitting: Family Medicine

## 2021-12-02 ENCOUNTER — Other Ambulatory Visit: Payer: Self-pay | Admitting: Family Medicine

## 2021-12-03 NOTE — Telephone Encounter (Signed)
Requested medication (s) are due for refill today: no  Requested medication (s) are on the active medication list:yes  Last refill:  03/09/21  Future visit scheduled: no  Notes to clinic:  Unable to refill per protocol, appointment needed.  Called pt, no answer. Unable to leave VM. The medication request is too soon. Last refill 03/09/21 for 90 and 3 RF. Routing for review.     Requested Prescriptions  Pending Prescriptions Disp Refills   atorvastatin (LIPITOR) 20 MG tablet [Pharmacy Med Name: Atorvastatin Calcium 20 MG Oral Tablet] 100 tablet 2    Sig: TAKE 1 TABLET BY MOUTH ONCE  DAILY     Cardiovascular:  Antilipid - Statins Failed - 12/01/2021 11:30 PM      Failed - Valid encounter within last 12 months    Recent Outpatient Visits           1 year ago Abscess   [place] Susy Frizzle, MD   1 year ago [place] Dennard Schaumann, Cammie Mcgee, MD   3 years ago Pyogenic granuloma   [place] Dennard Schaumann, Cammie Mcgee, MD   4 years ago Essential hypertension   [place], Mary B, PA-C   6 years ago RUQ abdominal pain   [place] Dennard Schaumann, Cammie Mcgee, MD              Failed - Lipid Panel in normal range within the last 12 months    Cholesterol  Date Value Ref Range Status  07/13/2017 136 <200 mg/dL Final   LDL Cholesterol (Calc)  Date Value Ref Range Status  07/13/2017 70 mg/dL (calc) Final    Comment:    Reference range: <100 . Desirable range <100 mg/dL for primary prevention;   <70 mg/dL for patients with CHD or diabetic patients  with > or = 2 CHD risk factors. Marland Kitchen LDL-C is now calculated using the Martin-Hopkins  calculation, which is a validated novel method providing  better accuracy than the Friedewald equation in the  estimation of LDL-C.  Cresenciano Genre et al. Annamaria Helling. 4034;742(59): 2061-2068  (http://education.[facility].com/faq/FAQ164)    HDL  Date Value Ref  Range Status  07/13/2017 45 >40 mg/dL Final   Triglycerides  Date Value Ref Range Status  07/13/2017 128 <150 mg/dL Final         Passed - Patient is not pregnant

## 2022-01-06 NOTE — Patient Instructions (Incomplete)
Please continue using your CPAP regularly. While your insurance requires that you use CPAP at least 4 hours each night on 70% of the nights, I recommend, that you not skip any nights and use it throughout the night if you can. Getting used to CPAP and staying with the treatment long term does take time and patience and discipline. Untreated obstructive sleep apnea when it is moderate to severe can have an adverse impact on cardiovascular health and raise her risk for heart disease, arrhythmias, hypertension, congestive heart failure, stroke and diabetes. Untreated obstructive sleep apnea causes sleep disruption, nonrestorative sleep, and sleep deprivation. This can have an impact on your day to day functioning and cause daytime sleepiness and impairment of cognitive function, memory loss, mood disturbance, and problems focussing. Using CPAP regularly can improve these symptoms.  Follow up in  

## 2022-01-06 NOTE — Progress Notes (Deleted)
PATIENT: Keith Day DOB: 1950-11-17  REASON FOR VISIT: follow up HISTORY FROM: patient  No chief complaint on file.    HISTORY OF PRESENT ILLNESS:  01/06/22 ALL:  Keith Day returns for follow up for OSA on CPAP. He was last seen 06/2021 and had not used CPAP. He was needing updated DOT physical and we discussed importance of using CPAP. Since,   07/09/2021 ALL: Keith Day returns for follow up for OSA on CPAP. He was last seen 02/2021 and having difficulty with compliance. He tells me, today, that he has not resumed therapy. He just doesn't like to use CPAP and was not sure he really needed it. 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 He was not aware he had to show consistent use of CPAP.   03/03/2021 ALL:  Keith Day is a 71 y.o. male here today for follow up for OSA on CPAP.  He continues to struggle with tolerating CPAP. He has used it a couple of times for about an hour but admits that he has not had any significant usage to evaluate. He is not sure he can continue CPAP. He requested a new nasal mask but did not receive from [facility]. He did not call to check. He is using a FFM now and feels that he fights with it. He is willing to continue trying.   HISTORY: (copied from Dr Guadelupe Sabin previous note)  Keith Day is a 71 year old right-handed gentleman with an underlying medical history of diabetes, hypertension, diverticulitis, and obesity, who presents for follow-up consultation of his sleep disturbance, including obstructive sleep apnea.  The patient is unaccompanied today.  I first met him at the request of his primary care physician on 05/12/2020, at which time he reported snoring and difficulty maintaining sleep.  He was advised to proceed with a sleep study.  He had a baseline sleep study on 06/13/2020, which showed a sleep latency of 5.5 minutes, sleep efficiency 76.8%, REM latency 139 minutes.  He had a total AHI of 27.9/h, REM AHI 28.6/h,  supine AHI 68.3/h.  Average oxygen saturation was 93%, nadir was 66%.  Time below 89% saturation was 61 minutes.  He was advised to return for a titration study.  He had an attempt at CPAP titration on 07/08/2020 but did not achieve any sleep.  He was advised to consider AutoPap therapy.  He requested a follow-up appointment to discuss further.   Today, 09/23/2020: He reports no new symptoms.  He reports that he could not tolerate the air at the time of his CPAP titration.  He is not sure if he is going to be able to use the machine but is willing to try.  REVIEW OF SYSTEMS: Out of a complete 14 system review of symptoms, the patient complains only of the following symptoms, wheezing and all other reviewed systems are negative.  ESS: 8  ALLERGIES: No Known Allergies  HOME MEDICATIONS: Outpatient Medications Prior to Visit  Medication Sig Dispense Refill   atorvastatin (LIPITOR) 20 MG tablet TAKE 1 TABLET BY MOUTH ONCE  DAILY 100 tablet 2   lisinopril (ZESTRIL) 10 MG tablet TAKE 1 TABLET BY MOUTH DAILY 100 tablet 0   No facility-administered medications prior to visit.    PAST MEDICAL HISTORY: Past Medical History:  Diagnosis Date   Colon polyps    Diabetes mellitus without complication ([place])    Diverticulitis    Hypertension     PAST  SURGICAL HISTORY: Past Surgical History:  Procedure Laterality Date   APPENDECTOMY     FRACTURE SURGERY      FAMILY HISTORY: Family History  Problem Relation Age of Onset   Cancer Father 17       Lung Cancer    SOCIAL HISTORY: Social History   Socioeconomic History   Marital status: Married    Spouse name: Not on file   Number of children: Not on file   Years of education: Not on file   Highest education level: Not on file  Occupational History   Not on file  Tobacco Use   Smoking status: Former    Types: Cigarettes    Quit date: 02/21/1998    Years since quitting: 23.8   Smokeless tobacco: Never  Substance and Sexual Activity    Alcohol use: Yes   Drug use: No   Sexual activity: Not on file  Other Topics Concern   Not on file  Social History Narrative   Not on file   Social Determinants of Health   Financial Resource Strain: Low Risk  (01/22/2021)   Overall Financial Resource Strain (CARDIA)    Difficulty of Paying Living Expenses: Not hard at all  Food Insecurity: No Food Insecurity (01/22/2021)   Hunger Vital Sign    Worried About Running Out of Food in the Last Year: Never true    [place] in the Last Year: Never true  Transportation Needs: No Transportation Needs (01/22/2021)   PRAPARE - Hydrologist (Medical): No    Lack of Transportation (Non-Medical): No  Physical Activity: Sufficiently Active (01/22/2021)   Exercise Vital Sign    Days of Exercise per Week: 5 days    Minutes of Exercise per Session: 30 min  Stress: No Stress Concern Present (01/22/2021)   [place]    Feeling of Stress : Not at all  Social Connections: Moderately Isolated (01/22/2021)   Social Connection and Isolation Panel [NHANES]    Frequency of Communication with Friends and Family: More than three times a week    Frequency of Social Gatherings with Friends and Family: More than three times a week    Attends Religious Services: Never    Marine scientist or Organizations: No    Attends Archivist Meetings: Never    Marital Status: Married  Human resources officer Violence: Not At Risk (01/22/2021)   Humiliation, Afraid, Rape, and Kick questionnaire    Fear of Current or Ex-Partner: No    Emotionally Abused: No    Physically Abused: No    Sexually Abused: No     PHYSICAL EXAM  There were no vitals filed for this visit.   There is no height or weight on file to calculate BMI.  Generalized: Well developed, in no acute distress  Cardiology: normal rate and rhythm, no murmur noted Respiratory: clear to  auscultation bilaterally  Neurological examination  Mentation: Alert oriented to time, place, history taking. Follows all commands speech and language fluent Cranial nerve II-XII: Pupils were equal round reactive to light. Extraocular movements were full, visual field were full  Motor: The motor testing reveals 5 over 5 strength of all 4 extremities. Good symmetric motor tone is noted throughout.  Gait and station: Gait is normal.    DIAGNOSTIC DATA (LABS, IMAGING, TESTING) - I reviewed patient records, labs, notes, testing and imaging myself where available.      No  data to display           Lab Results  Component Value Date   WBC 8.7 06/05/2015   HGB 16.0 06/05/2015   HCT 46.5 06/05/2015   MCV 87.1 06/05/2015   PLT 221 06/05/2015      Component Value Date/Time   NA 140 07/13/2017 0822   K 4.7 07/13/2017 0822   CL 105 07/13/2017 0822   CO2 26 07/13/2017 0822   GLUCOSE 102 (H) 07/13/2017 0822   BUN 16 07/13/2017 0822   CREATININE 1.23 07/13/2017 0822   CALCIUM 9.4 07/13/2017 0822   PROT 6.9 07/13/2017 0822   ALBUMIN 4.5 06/05/2015 1247   AST 27 07/13/2017 0822   ALT 29 07/13/2017 0822   ALKPHOS 63 06/05/2015 1247   BILITOT 0.7 07/13/2017 0822   GFRNONAA 60 07/13/2017 0822   GFRAA 70 07/13/2017 0822   Lab Results  Component Value Date   CHOL 136 07/13/2017   HDL 45 07/13/2017   LDLCALC 70 07/13/2017   TRIG 128 07/13/2017   CHOLHDL 3.0 07/13/2017   No results found for: "HGBA1C" No results found for: "VITAMINB12" Lab Results  Component Value Date   TSH 0.98 07/13/2017     ASSESSMENT AND PLAN 71 y.o. year old male  has a past medical history of Colon polyps, Diabetes mellitus without complication ([place]), Diverticulitis, and Hypertension. here with   No diagnosis found.    SANI MADARIAGA is having difficulty tolerating CPAP therapy. He has not used therapy since last visit. We have discussed sleep study results. He does recognize need for therapy. He is  due for DOT physical and understands need for compliance. We have discussed using machine while awake to help him become more comfortable with using CPAP. He was encouraged to shoot for using CPAP nightly and for greater than 4 hours each night. He will start with small goals and build. Risks of untreated sleep apnea review and education materials provided. Healthy lifestyle habits encouraged. He will follow up in 3-4 months, sooner if needed. He verbalizes understanding and agreement with this plan.    No orders of the defined types were placed in this encounter.     No orders of the defined types were placed in this encounter.      Debbora Presto, FNP-C 01/06/2022, 11:07 AM [facility] [address] [place], [place] [address] [phone]

## 2022-01-11 ENCOUNTER — Encounter: Payer: Self-pay | Admitting: Family Medicine

## 2022-01-11 ENCOUNTER — Ambulatory Visit: Payer: Medicare Other | Admitting: Family Medicine

## 2022-01-11 DIAGNOSIS — G4733 Obstructive sleep apnea (adult) (pediatric): Secondary | ICD-10-CM

## 2022-02-08 ENCOUNTER — Encounter: Payer: Self-pay | Admitting: Family Medicine

## 2022-02-18 ENCOUNTER — Telehealth: Payer: Self-pay | Admitting: Family Medicine

## 2022-02-18 NOTE — Telephone Encounter (Signed)
No answer unable to leave a message for patient to call back and schedule Medicare Annual Wellness Visit (AWV) in office.   If not able to come in office, please offer to do virtually or by telephone.   Last AWV: 01/22/2021   Please schedule at anytime with [facility]  If any questions, please contact me at [phone].  Thank you ,  Colletta [place]

## 2022-02-18 NOTE — Telephone Encounter (Signed)
Left message for patient to call back and schedule Medicare Annual Wellness Visit (AWV) in office.   If not able to come in office, please offer to do virtually or by telephone.   Last AWV: 01/22/2021   Please schedule at anytime with [facility]  If any questions, please contact me at [phone].  Thank you ,  Colletta [place]

## 2022-03-06 ENCOUNTER — Other Ambulatory Visit: Payer: Self-pay | Admitting: Family Medicine

## 2022-03-08 NOTE — Telephone Encounter (Signed)
PT NEED CPE APPT W/PCP FOR FUTURE REFILLS  

## 2022-03-19 NOTE — Telephone Encounter (Signed)
Called patient to schedule appointment; no answer, and no voicemail.

## 2022-04-19 ENCOUNTER — Telehealth: Payer: Self-pay | Admitting: Family Medicine

## 2022-04-19 NOTE — Telephone Encounter (Signed)
No answer unable to leave a message for patient to call back and schedule Medicare Annual Wellness Visit (AWV) in office.   If not able to come in office, please offer to do virtually or by telephone.   Last AWV:01/22/2021   Please schedule at any time with [facility].  30 minute appointment  Any questions, please contact me at [phone]   Thank you,   [facility]  Ambulatory Clinical Support for [place] [facility] ??[phone] or ??[phone]

## 2022-04-22 ENCOUNTER — Other Ambulatory Visit: Payer: Self-pay

## 2022-04-22 DIAGNOSIS — Z1211 Encounter for screening for malignant neoplasm of colon: Secondary | ICD-10-CM

## 2022-04-26 ENCOUNTER — Other Ambulatory Visit: Payer: Self-pay | Admitting: Family Medicine

## 2022-04-27 NOTE — Telephone Encounter (Signed)
Requested medications are due for refill today.  yes  Requested medications are on the active medications list.  yes  Last refill. 03/08/2022 #30 0 rf  Future visit scheduled.   no  Notes to clinic.  Pt has already been given a courtesy refill. Called pt - Call cannot be completed at this time.    Requested Prescriptions  Pending Prescriptions Disp Refills   lisinopril (ZESTRIL) 10 MG tablet [Pharmacy Med Name: Lisinopril 10 MG Oral Tablet] 100 tablet 2    Sig: TAKE 1 TABLET BY MOUTH DAILY     Cardiovascular:  ACE Inhibitors Failed - 04/26/2022  5:02 PM      Failed - Cr in normal range and within 180 days    Creat  Date Value Ref Range Status  07/13/2017 1.23 0.70 - 1.25 mg/dL Final    Comment:    For patients >38 years of age, the reference limit for Creatinine is approximately 13% higher for people identified as African-American. .          Failed - K in normal range and within 180 days    Potassium  Date Value Ref Range Status  07/13/2017 4.7 3.5 - 5.3 mmol/L Final         Failed - Last BP in normal range    BP Readings from Last 1 Encounters:  07/09/21 (!) 154/88         Failed - Valid encounter within last 6 months    Recent Outpatient Visits           1 year ago Abscess   [place] Susy Frizzle, MD   2 years ago [place] Dennard Schaumann Cammie Mcgee, MD   4 years ago Pyogenic granuloma   [place] Dennard Schaumann, Cammie Mcgee, MD   4 years ago Essential hypertension   [place], [place], PA-C   6 years ago RUQ abdominal pain   [place], Cammie Mcgee, MD              Passed - Patient is not pregnant

## 2022-04-27 NOTE — Telephone Encounter (Signed)
Called pt to make an appointment. "Call cannot be completed at this time."

## 2022-05-12 ENCOUNTER — Other Ambulatory Visit: Payer: Self-pay | Admitting: Family Medicine

## 2022-05-13 NOTE — Telephone Encounter (Signed)
Requested medication (s) are due for refill today: yes  Requested medication (s) are on the active medication list: yes  Last refill:  04/28/22 #30  Future visit scheduled: no  Notes to clinic:  [facility] does not schedule CPE's for this office. Sent pt message via MyChart to call and schedule appt.    Requested Prescriptions  Pending Prescriptions Disp Refills   lisinopril (ZESTRIL) 10 MG tablet [Pharmacy Med Name: Lisinopril 10 MG Oral Tablet] 30 tablet 11    Sig: TAKE 1 TABLET BY MOUTH DAILY     Cardiovascular:  ACE Inhibitors Failed - 05/12/2022 10:04 PM      Failed - Cr in normal range and within 180 days    Creat  Date Value Ref Range Status  07/13/2017 1.23 0.70 - 1.25 mg/dL Final    Comment:    For patients >13 years of age, the reference limit for Creatinine is approximately 13% higher for people identified as African-American. .          Failed - K in normal range and within 180 days    Potassium  Date Value Ref Range Status  07/13/2017 4.7 3.5 - 5.3 mmol/L Final         Failed - Last BP in normal range    BP Readings from Last 1 Encounters:  07/09/21 (!) 154/88         Failed - Valid encounter within last 6 months    Recent Outpatient Visits           1 year ago Abscess   [place] Susy Frizzle, MD   2 years ago [place] Dennard Schaumann Cammie Mcgee, MD   4 years ago Pyogenic granuloma   [place] Dennard Schaumann, Cammie Mcgee, MD   4 years ago Essential hypertension   [place], [place], PA-C   6 years ago RUQ abdominal pain   [place], Cammie Mcgee, MD              Passed - Patient is not pregnant

## 2022-06-17 ENCOUNTER — Other Ambulatory Visit: Payer: Self-pay | Admitting: Family Medicine

## 2022-06-18 NOTE — Telephone Encounter (Signed)
Unable to refill per protocol, appointment needed.  Patient has 1 refill for 30 days left. Needs OV for additional refills.  Requested Prescriptions  Pending Prescriptions Disp Refills   lisinopril (ZESTRIL) 10 MG tablet [Pharmacy Med Name: Lisinopril 10 MG Oral Tablet] 60 tablet 5    Sig: TAKE 1 TABLET BY MOUTH DAILY     Cardiovascular:  ACE Inhibitors Failed - 06/17/2022 10:24 PM      Failed - Cr in normal range and within 180 days    Creat  Date Value Ref Range Status  07/13/2017 1.23 0.70 - 1.25 mg/dL Final    Comment:    For patients >53 years of age, the reference limit for Creatinine is approximately 13% higher for people identified as African-American. .          Failed - K in normal range and within 180 days    Potassium  Date Value Ref Range Status  07/13/2017 4.7 3.5 - 5.3 mmol/L Final         Failed - Last BP in normal range    BP Readings from Last 1 Encounters:  07/09/21 (!) 154/88         Failed - Valid encounter within last 6 months    Recent Outpatient Visits           1 year ago Abscess   [place] Susy Frizzle, MD   2 years ago [place] Dennard Schaumann Cammie Mcgee, MD   4 years ago Pyogenic granuloma   [place] Dennard Schaumann, Cammie Mcgee, MD   4 years ago Essential hypertension   [place], [place], PA-C   7 years ago RUQ abdominal pain   [place], Cammie Mcgee, MD              Passed - Patient is not pregnant

## 2022-09-06 ENCOUNTER — Telehealth: Payer: Self-pay | Admitting: Family Medicine

## 2022-09-06 ENCOUNTER — Other Ambulatory Visit: Payer: Self-pay

## 2022-09-06 DIAGNOSIS — I10 Essential (primary) hypertension: Secondary | ICD-10-CM

## 2022-09-06 MED ORDER — LISINOPRIL 10 MG PO TABS: 10.0000 mg | ORAL_TABLET | Freq: Every day | ORAL | 1 refills | Status: DC

## 2022-09-06 NOTE — Telephone Encounter (Signed)
Prescription Request  09/06/2022  LOV: Visit date not found  What is the name of the medication or equipment?   lisinopril (ZESTRIL) 10 MG tablet  **patient has 2 pills left**  Have you contacted your pharmacy to request a refill? Yes   Which pharmacy would you like this sent to?  [facility]) [place], [place] - [address] [place] [address] [place] [address] Phone: [phone] Fax: [phone]    Patient notified that their request is being sent to the clinical staff for review and that they should receive a response within 2 business days.   Please advise patient when refill sent in at [phone].

## 2022-09-10 ENCOUNTER — Other Ambulatory Visit: Payer: Self-pay | Admitting: Family Medicine

## 2022-09-13 NOTE — Telephone Encounter (Signed)
Unable to refill per protocol, Rx request is too soon. Last refill 12/03/21 for 100 and 2 refills. Patient needs OV for additional refills.  Requested Prescriptions  Pending Prescriptions Disp Refills   atorvastatin (LIPITOR) 20 MG tablet [Pharmacy Med Name: Atorvastatin Calcium 20 MG Oral Tablet] 100 tablet 2    Sig: TAKE 1 TABLET BY MOUTH ONCE  DAILY     Cardiovascular:  Antilipid - Statins Failed - 09/10/2022 10:43 PM      Failed - Valid encounter within last 12 months    Recent Outpatient Visits           2 years ago Abscess   [facility] Donita Brooks, MD   2 years ago Apnea   [facility] Tanya Nones, Priscille Heidelberg, MD   4 years ago Pyogenic granuloma   [facility] Tanya Nones, Priscille Heidelberg, MD   5 years ago Essential hypertension   [facility] Allayne Butcher B, PA-C   7 years ago RUQ abdominal pain   [facility] Tanya Nones, Priscille Heidelberg, MD              Failed - Lipid Panel in normal range within the last 12 months    Cholesterol  Date Value Ref Range Status  07/13/2017 136 <200 mg/dL Final   LDL Cholesterol (Calc)  Date Value Ref Range Status  07/13/2017 70 mg/dL (calc) Final    Comment:    Reference range: <100 . Desirable range <100 mg/dL for primary prevention;   <70 mg/dL for patients with CHD or diabetic patients  with > or = 2 CHD risk factors. Marland Kitchen LDL-C is now calculated using the Martin-Hopkins  calculation, which is a validated novel method providing  better accuracy than the Friedewald equation in the  estimation of LDL-C.  Horald Pollen et al. Lenox Ahr. 1610;960(45): 2061-2068  (http://education.[employer].com/faq/FAQ164)    HDL  Date Value Ref Range Status  07/13/2017 45 >40 mg/dL Final   Triglycerides  Date Value Ref Range Status  07/13/2017 128 <150 mg/dL Final         Passed - Patient is not pregnant

## 2022-11-10 ENCOUNTER — Other Ambulatory Visit: Payer: Self-pay | Admitting: Family Medicine

## 2022-11-10 DIAGNOSIS — I10 Essential (primary) hypertension: Secondary | ICD-10-CM

## 2022-11-11 NOTE — Telephone Encounter (Signed)
Requested Prescriptions  Pending Prescriptions Disp Refills   lisinopril (ZESTRIL) 10 MG tablet [Pharmacy Med Name: Lisinopril 10 MG Oral Tablet] 100 tablet 2    Sig: TAKE 1 TABLET BY MOUTH DAILY     Cardiovascular:  ACE Inhibitors Failed - 11/10/2022 10:40 PM      Failed - Cr in normal range and within 180 days    Creat  Date Value Ref Range Status  07/13/2017 1.23 0.70 - 1.25 mg/dL Final    Comment:    For patients >73 years of age, the reference limit for Creatinine is approximately 13% higher for people identified as African-American. .          Failed - K in normal range and within 180 days    Potassium  Date Value Ref Range Status  07/13/2017 4.7 3.5 - 5.3 mmol/L Final         Failed - Last BP in normal range    BP Readings from Last 1 Encounters:  07/09/21 (!) 154/88         Failed - Valid encounter within last 6 months    Recent Outpatient Visits           2 years ago Abscess   [facility] Donita Brooks, MD   2 years ago Apnea   [facility] Tanya Nones Priscille Heidelberg, MD   4 years ago Pyogenic granuloma   [facility] Tanya Nones, Priscille Heidelberg, MD   5 years ago Essential hypertension   [facility] Dorena Bodo, PA-C   7 years ago RUQ abdominal pain   [facility] [place], Priscille Heidelberg, MD              Passed - Patient is not pregnant

## 2023-01-25 ENCOUNTER — Other Ambulatory Visit: Payer: Self-pay | Admitting: Family Medicine

## 2023-01-25 DIAGNOSIS — I10 Essential (primary) hypertension: Secondary | ICD-10-CM

## 2023-01-26 NOTE — Telephone Encounter (Signed)
Requested medication (s) are due for refill today:   Yes  Requested medication (s) are on the active medication list:   Yes  Future visit scheduled:   No   LOV 08/05/2020   Needs OV/CPE   Last ordered: 09/06/2022 #90, 1 refill  Needs OV and labs.      Requested Prescriptions  Pending Prescriptions Disp Refills   lisinopril (ZESTRIL) 10 MG tablet [Pharmacy Med Name: Lisinopril 10 MG Oral Tablet] 80 tablet 3    Sig: TAKE 1 TABLET BY MOUTH DAILY     Cardiovascular:  ACE Inhibitors Failed - 01/25/2023 10:06 PM      Failed - Cr in normal range and within 180 days    Creat  Date Value Ref Range Status  07/13/2017 1.23 0.70 - 1.25 mg/dL Final    Comment:    For patients >78 years of age, the reference limit for Creatinine is approximately 13% higher for people identified as African-American. .          Failed - K in normal range and within 180 days    Potassium  Date Value Ref Range Status  07/13/2017 4.7 3.5 - 5.3 mmol/L Final         Failed - Last BP in normal range    BP Readings from Last 1 Encounters:  07/09/21 (!) 154/88         Failed - Valid encounter within last 6 months    Recent Outpatient Visits           2 years ago Abscess   [facility] Donita Brooks, MD   2 years ago Apnea   [facility] Tanya Nones Priscille Heidelberg, MD   4 years ago Pyogenic granuloma   [facility] Tanya Nones, Priscille Heidelberg, MD   5 years ago Essential hypertension   [facility] Dorena Bodo, PA-C   7 years ago RUQ abdominal pain   [facility] [place], Priscille Heidelberg, MD              Passed - Patient is not pregnant

## 2023-02-03 NOTE — Telephone Encounter (Signed)
Received 2nd request for this medication.

## 2023-02-21 ENCOUNTER — Other Ambulatory Visit: Payer: Self-pay | Admitting: Family Medicine

## 2023-02-21 DIAGNOSIS — I10 Essential (primary) hypertension: Secondary | ICD-10-CM

## 2023-02-21 NOTE — Telephone Encounter (Signed)
Received fax from pharmacy requesting refill on lisinopril (ZESTRIL) 10 MG tablet . I have called patient to schedule an appointment since he hasn't been seen by pcp since 07/2020 I had to leave a voicemail.   Prescription Request  02/21/2023  LOV: Visit date not found  What is the name of the medication or equipment? lisinopril (ZESTRIL) 10 MG tablet   Have you contacted your pharmacy to request a refill? Yes   Which pharmacy would you like this sent to? [facility]   Patient notified that their request is being sent to the clinical staff for review and that they should receive a response within 2 business days.   Please advise at [facility] [phone]

## 2023-02-22 ENCOUNTER — Other Ambulatory Visit: Payer: Self-pay

## 2023-02-22 DIAGNOSIS — I10 Essential (primary) hypertension: Secondary | ICD-10-CM

## 2023-02-22 MED ORDER — LISINOPRIL 10 MG PO TABS: 10.0000 mg | ORAL_TABLET | Freq: Every day | ORAL | 0 refills | Status: DC

## 2023-02-24 NOTE — Telephone Encounter (Signed)
Duplicate request- Rx 02/22/23 #30 courtesy RF Requested Prescriptions  Pending Prescriptions Disp Refills   lisinopril (ZESTRIL) 10 MG tablet 90 tablet 1    Sig: Take 1 tablet (10 mg total) by mouth daily.     Cardiovascular:  ACE Inhibitors Failed - 02/21/2023 11:18 AM      Failed - Cr in normal range and within 180 days    Creat  Date Value Ref Range Status  07/13/2017 1.23 0.70 - 1.25 mg/dL Final    Comment:    For patients >73 years of age, the reference limit for Creatinine is approximately 13% higher for people identified as African-American. .          Failed - K in normal range and within 180 days    Potassium  Date Value Ref Range Status  07/13/2017 4.7 3.5 - 5.3 mmol/L Final         Failed - Last BP in normal range    BP Readings from Last 1 Encounters:  07/09/21 (!) 154/88         Failed - Valid encounter within last 6 months    Recent Outpatient Visits           2 years ago Abscess   [facility] Donita Brooks, MD   2 years ago Apnea   [facility] Tanya Nones Priscille Heidelberg, MD   4 years ago Pyogenic granuloma   [facility] Tanya Nones, Priscille Heidelberg, MD   5 years ago Essential hypertension   [facility] Dorena Bodo, PA-C   7 years ago RUQ abdominal pain   [facility] [place], Priscille Heidelberg, MD              Passed - Patient is not pregnant

## 2023-03-09 ENCOUNTER — Other Ambulatory Visit: Payer: Self-pay | Admitting: Family Medicine

## 2023-03-09 DIAGNOSIS — I10 Essential (primary) hypertension: Secondary | ICD-10-CM

## 2023-04-10 DIAGNOSIS — J018 Other acute sinusitis: Secondary | ICD-10-CM | POA: Diagnosis not present

## 2023-06-01 ENCOUNTER — Encounter: Payer: Self-pay | Admitting: Family Medicine

## 2023-06-01 ENCOUNTER — Ambulatory Visit (INDEPENDENT_AMBULATORY_CARE_PROVIDER_SITE_OTHER): Admitting: Family Medicine

## 2023-06-01 VITALS — BP 138/80 | HR 81 | Temp 98.0°F | Ht 71.0 in | Wt 280.0 lb

## 2023-06-01 DIAGNOSIS — Z1159 Encounter for screening for other viral diseases: Secondary | ICD-10-CM

## 2023-06-01 DIAGNOSIS — E119 Type 2 diabetes mellitus without complications: Secondary | ICD-10-CM | POA: Diagnosis not present

## 2023-06-01 DIAGNOSIS — I10 Essential (primary) hypertension: Secondary | ICD-10-CM

## 2023-06-01 DIAGNOSIS — Z23 Encounter for immunization: Secondary | ICD-10-CM

## 2023-06-01 DIAGNOSIS — E785 Hyperlipidemia, unspecified: Secondary | ICD-10-CM | POA: Diagnosis not present

## 2023-06-01 NOTE — Assessment & Plan Note (Signed)
 Chronic well controlled today in office. No recent CPE with labs. Will obtain fasting labs and refill as appropriate. Encouraged to return to office for CPE. HM items reviewed and he will get his shingles vaccine today.  Recommend heart healthy diet such as Mediterranean diet with whole grains, fruits, vegetable, fish, lean meats, nuts, and olive oil. Limit salt. Encouraged moderate walking, 3-5 times/week for 30-50 minutes each session. Aim for at least 150 minutes.week. Goal should be pace of 3 miles/hours, or walking 1.5 miles in 30 minutes. Avoid tobacco products. Avoid excess alcohol. Take medications as prescribed and bring medications and blood pressure log with cuff to each office visit. Seek medical care for chest pain, palpitations, shortness of breath with exertion, dizziness/lightheadedness, vision changes, recurrent headaches, or swelling of extremities. Follow up with PCP

## 2023-06-01 NOTE — Assessment & Plan Note (Signed)
 Counseled on importance of weight management for overall health. Encouraged low calorie, heart healthy diet and moderate intensity exercise 150 minutes weekly. This is 3-5 times weekly for 30-50 minutes each session. Goal should be pace of 3 miles/hours, or walking 1.5 miles in 30 minutes and include strength training.  Return to PCP for discussion of medication options.

## 2023-06-01 NOTE — Assessment & Plan Note (Signed)
 Fasting labs today. Will refill Atorvastatin as appropriate. Your labs showed elevated cholesterol. I recommend consuming a heart healthy diet such as Mediterranean diet or DASH diet with whole grains, fruits, vegetable, fish, lean meats, nuts, and olive oil. Limit sweets and processed foods. I also encourage moderate intensity exercise 150 minutes weekly. This is 3-5 times weekly for 30-50 minutes each session. Goal should be pace of 3 miles/hours, or walking 1.5 miles in 30 minutes. The ASCVD Risk score (Arnett DK, et al., 2019) failed to calculate for the following reasons:   Cannot find a previous HDL lab   Cannot find a previous total cholesterol lab

## 2023-06-01 NOTE — Addendum Note (Signed)
 Addended by: Venia Carbon K on: 06/01/2023 10:13 AM   Modules accepted: Orders

## 2023-06-01 NOTE — Progress Notes (Signed)
 Subjective:  HPI: Keith Day is a 73 y.o. male presenting on 06/01/2023 for Follow-up (Here for blood pressure medication refills. Pt would like to discuss weight loss medication. )   HPI Patient is in today for refill of his medications. He is currently taking Atorvastatin 20mg  and Lisinopril 10mg  daily. He reports compliance and does have remaining doses. Keith Day is overdue for his annual physical. Has not had labs drawn within last year.   HYPERTENSION / HYPERLIPIDEMIA Satisfied with current treatment? yes Duration of hypertension: chronic BP monitoring frequency: not checking BP range:  BP medication side effects: no Past BP meds: lisinopril Duration of hyperlipidemia: chronic Cholesterol medication side effects: no Cholesterol supplements: none Past cholesterol medications: atorvastain (lipitor) Medication compliance: excellent compliance Aspirin: no Recent stressors: no Recurrent headaches: no Visual changes: no Palpitations: no Dyspnea: no Chest pain: no Lower extremity edema: no Dizzy/lightheaded: no   Review of Systems  All other systems reviewed and are negative.   Relevant past medical history reviewed and updated as indicated.   Past Medical History:  Diagnosis Date   Colon polyps    Diabetes mellitus without complication (HCC)    Diverticulitis    Hypertension      Past Surgical History:  Procedure Laterality Date   APPENDECTOMY     FRACTURE SURGERY      Allergies and medications reviewed and updated.   Current Outpatient Medications:    atorvastatin (LIPITOR) 20 MG tablet, TAKE 1 TABLET BY MOUTH ONCE  DAILY, Disp: 100 tablet, Rfl: 2   lisinopril (ZESTRIL) 10 MG tablet, Take 1 tablet (10 mg total) by mouth daily., Disp: 30 tablet, Rfl: 0  No Known Allergies  Objective:   BP 138/80   Pulse 81   Temp 98 F (36.7 C)   Ht 5\' 11"  (1.803 m)   Wt 280 lb (127 kg)   SpO2 99%   BMI 39.05 kg/m      06/01/2023    9:00 AM 07/09/2021     7:36 AM 03/03/2021   11:22 AM  Vitals with BMI  Height 5\' 11"  5\' 11"  5\' 11"   Weight 280 lbs 274 lbs 275 lbs  BMI 39.07 38.23 38.37  Systolic 138 154 161  Diastolic 80 88 72  Pulse 81 72 83     Physical Exam Vitals and nursing note reviewed.  Constitutional:      Appearance: Normal appearance. He is obese.  HENT:     Head: Normocephalic and atraumatic.  Cardiovascular:     Rate and Rhythm: Normal rate and regular rhythm.     Pulses: Normal pulses.     Heart sounds: Normal heart sounds.  Pulmonary:     Effort: Pulmonary effort is normal.     Breath sounds: Normal breath sounds.  Skin:    General: Skin is warm and dry.     Capillary Refill: Capillary refill takes less than 2 seconds.  Neurological:     General: No focal deficit present.     Mental Status: He is alert and oriented to person, place, and time. Mental status is at baseline.  Psychiatric:        Mood and Affect: Mood normal.        Behavior: Behavior normal.        Thought Content: Thought content normal.        Judgment: Judgment normal.     Assessment & Plan:  Primary hypertension Assessment & Plan: Chronic well controlled today in office. No recent CPE  with labs. Will obtain fasting labs and refill as appropriate. Encouraged to return to office for CPE. HM items reviewed and he will get his shingles vaccine today.  Recommend heart healthy diet such as Mediterranean diet with whole grains, fruits, vegetable, fish, lean meats, nuts, and olive oil. Limit salt. Encouraged moderate walking, 3-5 times/week for 30-50 minutes each session. Aim for at least 150 minutes.week. Goal should be pace of 3 miles/hours, or walking 1.5 miles in 30 minutes. Avoid tobacco products. Avoid excess alcohol. Take medications as prescribed and bring medications and blood pressure log with cuff to each office visit. Seek medical care for chest pain, palpitations, shortness of breath with exertion, dizziness/lightheadedness, vision  changes, recurrent headaches, or swelling of extremities. Follow up with PCP  Orders: -     CBC with Differential/Platelet -     COMPLETE METABOLIC PANEL WITH GFR -     Lipid panel -     Hemoglobin A1c  Hyperlipidemia, unspecified hyperlipidemia type Assessment & Plan: Fasting labs today. Will refill Atorvastatin as appropriate. Your labs showed elevated cholesterol. I recommend consuming a heart healthy diet such as Mediterranean diet or DASH diet with whole grains, fruits, vegetable, fish, lean meats, nuts, and olive oil. Limit sweets and processed foods. I also encourage moderate intensity exercise 150 minutes weekly. This is 3-5 times weekly for 30-50 minutes each session. Goal should be pace of 3 miles/hours, or walking 1.5 miles in 30 minutes. The ASCVD Risk score (Arnett DK, et al., 2019) failed to calculate for the following reasons:   Cannot find a previous HDL lab   Cannot find a previous total cholesterol lab   Orders: -     Lipid panel  Need for hepatitis C screening test -     Hepatitis C antibody  Morbid obesity (HCC) Assessment & Plan: Counseled on importance of weight management for overall health. Encouraged low calorie, heart healthy diet and moderate intensity exercise 150 minutes weekly. This is 3-5 times weekly for 30-50 minutes each session. Goal should be pace of 3 miles/hours, or walking 1.5 miles in 30 minutes and include strength training.  Return to PCP for discussion of medication options.       Follow up plan: Return for annual physical with labs 1 week prior.  Park Meo, FNP

## 2023-06-02 LAB — CBC WITH DIFFERENTIAL/PLATELET
Absolute Lymphocytes: 2464 {cells}/uL (ref 850–3900)
Absolute Monocytes: 688 {cells}/uL (ref 200–950)
Basophils Absolute: 32 {cells}/uL (ref 0–200)
Basophils Relative: 0.4 %
Eosinophils Absolute: 80 {cells}/uL (ref 15–500)
Eosinophils Relative: 1 %
HCT: 46 % (ref 38.5–50.0)
Hemoglobin: 15.3 g/dL (ref 13.2–17.1)
MCH: 29.6 pg (ref 27.0–33.0)
MCHC: 33.3 g/dL (ref 32.0–36.0)
MCV: 89 fL (ref 80.0–100.0)
MPV: 10.6 fL (ref 7.5–12.5)
Monocytes Relative: 8.6 %
Neutro Abs: 4736 {cells}/uL (ref 1500–7800)
Neutrophils Relative %: 59.2 %
Platelets: 203 10*3/uL (ref 140–400)
RBC: 5.17 10*6/uL (ref 4.20–5.80)
RDW: 13.1 % (ref 11.0–15.0)
Total Lymphocyte: 30.8 %
WBC: 8 10*3/uL (ref 3.8–10.8)

## 2023-06-02 LAB — COMPLETE METABOLIC PANEL WITH GFR
AG Ratio: 1.8 (calc) (ref 1.0–2.5)
ALT: 26 U/L (ref 9–46)
AST: 25 U/L (ref 10–35)
Albumin: 4.3 g/dL (ref 3.6–5.1)
Alkaline phosphatase (APISO): 56 U/L (ref 35–144)
BUN: 17 mg/dL (ref 7–25)
CO2: 20 mmol/L (ref 20–32)
Calcium: 9.3 mg/dL (ref 8.6–10.3)
Chloride: 104 mmol/L (ref 98–110)
Creat: 1.12 mg/dL (ref 0.70–1.28)
Globulin: 2.4 g/dL (ref 1.9–3.7)
Glucose, Bld: 105 mg/dL — ABNORMAL HIGH (ref 65–99)
Potassium: 4.2 mmol/L (ref 3.5–5.3)
Sodium: 139 mmol/L (ref 135–146)
Total Bilirubin: 0.5 mg/dL (ref 0.2–1.2)
Total Protein: 6.7 g/dL (ref 6.1–8.1)
eGFR: 69 mL/min/{1.73_m2} (ref >=60)

## 2023-06-02 LAB — HEMOGLOBIN A1C
Hgb A1c MFr Bld: 5.9 %{Hb} — ABNORMAL HIGH (ref <5.7)
Mean Plasma Glucose: 123 mg/dL
eAG (mmol/L): 6.8 mmol/L

## 2023-06-02 LAB — LIPID PANEL
Cholesterol: 132 mg/dL (ref <200)
HDL: 44 mg/dL (ref >=40)
LDL Cholesterol (Calc): 65 mg/dL
Non-HDL Cholesterol (Calc): 88 mg/dL (ref <130)
Total CHOL/HDL Ratio: 3 (calc) (ref <5.0)
Triglycerides: 155 mg/dL — ABNORMAL HIGH (ref <150)

## 2023-06-02 LAB — HEPATITIS C ANTIBODY: Hepatitis C Ab: NONREACTIVE

## 2023-06-06 ENCOUNTER — Other Ambulatory Visit: Payer: Self-pay | Admitting: Family Medicine

## 2023-06-06 DIAGNOSIS — I10 Essential (primary) hypertension: Secondary | ICD-10-CM

## 2023-06-06 NOTE — Telephone Encounter (Signed)
 Copied from CRM [id number]. Topic: Clinical - Medication Refill >> Jun 06, 2023  9:07 AM Bo Mcclintock wrote: Most Recent Primary Care Visit:  Provider: Park Meo  Department: [facility]  Visit Type: OFFICE VISIT  Date: 06/01/2023  Medication: Atorvastatin lisinopril   Has the patient contacted their pharmacy? Yes (Agent: If no, request that the patient contact the pharmacy for the refill. If patient does not wish to contact the pharmacy document the reason why and proceed with request.) (Agent: If yes, when and what did the pharmacy advise?) Contact PCP  Is this the correct pharmacy for this prescription? No If no, delete pharmacy and type the correct one.  This is the patient's preferred pharmacy:    [id number] - [place] - [address] [place] [address] Phone: [phone] Fax: [phone]   Has the prescription been filled recently? No  Is the patient out of the medication? Yes  Has the patient been seen for an appointment in the last year OR does the patient have an upcoming appointment? Yes  Can we respond through MyChart? No  Agent: Please be advised that Rx refills may take up to 3 business days. We ask that you follow-up with your pharmacy.

## 2023-06-08 ENCOUNTER — Other Ambulatory Visit: Payer: Self-pay

## 2023-06-08 ENCOUNTER — Telehealth: Payer: Self-pay

## 2023-06-08 DIAGNOSIS — I10 Essential (primary) hypertension: Secondary | ICD-10-CM

## 2023-06-08 MED ORDER — ATORVASTATIN CALCIUM 20 MG PO TABS: 20.0000 mg | ORAL_TABLET | Freq: Every day | ORAL | 1 refills | Status: DC

## 2023-06-08 MED ORDER — LISINOPRIL 10 MG PO TABS: 10.0000 mg | ORAL_TABLET | Freq: Every day | ORAL | 1 refills | Status: DC

## 2023-06-08 NOTE — Telephone Encounter (Signed)
 Pt came in stating that he is completely out of these meds:  atorvastatin (LIPITOR) 20 MG tablet [[id number]] lisinopril (ZESTRIL) 10 MG tablet [[id number]].   Pt stated that he requested these refills last week at [facility] with pcp, but they have not been sent in. Pt would like for meds to be sent to [facility] at [facility] as this would be faster than his mail in pharmacy because pt has been completely out of these meds for 2 days. Please call pt when meds have been sent in.  Cb#: [phone]  PHARMACY: [facility] [address]

## 2023-06-13 ENCOUNTER — Encounter: Payer: Self-pay | Admitting: Family Medicine

## 2023-06-13 ENCOUNTER — Ambulatory Visit (INDEPENDENT_AMBULATORY_CARE_PROVIDER_SITE_OTHER): Admitting: Family Medicine

## 2023-06-13 VITALS — BP 152/72 | HR 93 | Temp 97.6°F | Ht 71.0 in | Wt 281.2 lb

## 2023-06-13 DIAGNOSIS — L723 Sebaceous cyst: Secondary | ICD-10-CM | POA: Diagnosis not present

## 2023-06-13 MED ORDER — TIRZEPATIDE 2.5 MG/0.5ML ~~LOC~~ SOAJ: 2.5000 mg | SUBCUTANEOUS | 1 refills | Status: DC

## 2023-06-13 NOTE — Progress Notes (Signed)
 Subjective:    Patient ID: Keith Day, male    DOB: 1951/01/19, 73 y.o.   MRN: [id number]  HPI  Patient is here today complaining of an inflamed sebaceous cyst.  He originally made appointment for a physical exam but is requesting that I remove the cyst.  The cyst is roughly 3 cm in diameter and is located at the level of T2 in the center of his back just to the left of the spine.  It is firm and indurated and slightly erythematous.  He also has a skin tag on his right clavicle that he would like removed.  He is overdue for a colonoscopy as well as prostate cancer screening.  3 cm inflamed sebaceous cyst his most recent lab work is listed below Office Visit on 06/01/2023  Component Date Value Ref Range Status   WBC 06/01/2023 8.0  3.8 - 10.8 Thousand/uL Final   RBC 06/01/2023 5.17  4.20 - 5.80 Million/uL Final   Hemoglobin 06/01/2023 15.3  13.2 - 17.1 g/dL Final   HCT 95/28/4132 46.0  38.5 - 50.0 % Final   MCV 06/01/2023 89.0  80.0 - 100.0 fL Final   MCH 06/01/2023 29.6  27.0 - 33.0 pg Final   MCHC 06/01/2023 33.3  32.0 - 36.0 g/dL Final   Comment: For adults, a slight decrease in the calculated MCHC value (in the range of 30 to 32 g/dL) is most likely not clinically significant; however, it should be interpreted with caution in correlation with other red cell parameters and the patient's clinical condition.    RDW 06/01/2023 13.1  11.0 - 15.0 % Final   Platelets 06/01/2023 203  140 - 400 Thousand/uL Final   MPV 06/01/2023 10.6  7.5 - 12.5 fL Final   Neutro Abs 06/01/2023 4,736  1,500 - 7,800 cells/uL Final   Absolute Lymphocytes 06/01/2023 2,464  850 - 3,900 cells/uL Final   Absolute Monocytes 06/01/2023 688  200 - 950 cells/uL Final   Eosinophils Absolute 06/01/2023 80  15 - 500 cells/uL Final   Basophils Absolute 06/01/2023 32  0 - 200 cells/uL Final   Neutrophils Relative % 06/01/2023 59.2  % Final   Total Lymphocyte 06/01/2023 30.8  % Final   Monocytes Relative 06/01/2023  8.6  % Final   Eosinophils Relative 06/01/2023 1.0  % Final   Basophils Relative 06/01/2023 0.4  % Final   Glucose, Bld 06/01/2023 105 (H)  65 - 99 mg/dL Final   Comment: .            Fasting reference interval . For someone without known diabetes, a glucose value between 100 and 125 mg/dL is consistent with prediabetes and should be confirmed with a follow-up test. .    BUN 06/01/2023 17  7 - 25 mg/dL Final   Creat 44/03/270 1.12  0.70 - 1.28 mg/dL Final   eGFR 53/66/4403 69  > OR = 60 mL/min/1.51m2 Final   BUN/Creatinine Ratio 06/01/2023 SEE NOTE:  6 - 22 (calc) Final   Comment:    Not Reported: BUN and Creatinine are within    reference range. .    Sodium 06/01/2023 139  135 - 146 mmol/L Final   Potassium 06/01/2023 4.2  3.5 - 5.3 mmol/L Final   Chloride 06/01/2023 104  98 - 110 mmol/L Final   CO2 06/01/2023 20  20 - 32 mmol/L Final   Calcium 06/01/2023 9.3  8.6 - 10.3 mg/dL Final   Total Protein 47/42/5956 6.7  6.1 - 8.1  g/dL Final   Albumin 86/57/8469 4.3  3.6 - 5.1 g/dL Final   Globulin 62/95/2841 2.4  1.9 - 3.7 g/dL (calc) Final   AG Ratio 06/01/2023 1.8  1.0 - 2.5 (calc) Final   Total Bilirubin 06/01/2023 0.5  0.2 - 1.2 mg/dL Final   Alkaline phosphatase (APISO) 06/01/2023 56  35 - 144 U/L Final   AST 06/01/2023 25  10 - 35 U/L Final   ALT 06/01/2023 26  9 - 46 U/L Final   Cholesterol 06/01/2023 132  <200 mg/dL Final   HDL 32/44/0102 44  > OR = 40 mg/dL Final   Triglycerides 72/53/6644 155 (H)  <150 mg/dL Final   LDL Cholesterol (Calc) 06/01/2023 65  mg/dL (calc) Final   Comment: Reference range: <100 . Desirable range <100 mg/dL for primary prevention;   <70 mg/dL for patients with CHD or diabetic patients  with > or = 2 CHD risk factors. Marland Kitchen LDL-C is now calculated using the Martin-Hopkins  calculation, which is a validated novel method providing  better accuracy than the Friedewald equation in the  estimation of LDL-C.  Horald Pollen et al. Lenox Ahr. 0347;425(95):  2061-2068  (http://education.[facility].com/faq/FAQ164)    Total CHOL/HDL Ratio 06/01/2023 3.0  <6.3 (calc) Final   Non-HDL Cholesterol (Calc) 06/01/2023 88  <130 mg/dL (calc) Final   Comment: For patients with diabetes plus 1 major ASCVD risk  factor, treating to a non-HDL-C goal of <100 mg/dL  (LDL-C of <87 mg/dL) is considered a therapeutic  option.    Hgb A1c MFr Bld 06/01/2023 5.9 (H)  <5.7 % of total Hgb Final   Comment: For someone without known diabetes, a hemoglobin  A1c value between 5.7% and 6.4% is consistent with prediabetes and should be confirmed with a  follow-up test. . For someone with known diabetes, a value <7% indicates that their diabetes is well controlled. A1c targets should be individualized based on duration of diabetes, age, comorbid conditions, and other considerations. . This assay result is consistent with an increased risk of diabetes. . Currently, no consensus exists regarding use of hemoglobin A1c for diagnosis of diabetes for children. .    Mean Plasma Glucose 06/01/2023 123  mg/dL Final   eAG (mmol/L) 56/43/3295 6.8  mmol/L Final   Hepatitis C Ab 06/01/2023 NON-REACTIVE  NON-REACTIVE Final   Comment: . HCV antibody was non-reactive. There is no laboratory  evidence of HCV infection. . In most cases, no further action is required. However, if recent HCV exposure is suspected, a test for HCV RNA (test code 18841) is suggested. . For additional information please refer to http://education.[facility].com/faq/FAQ22v1 (This link is being provided for informational/ educational purposes only.) .     Past Medical History:  Diagnosis Date   Colon polyps    Diabetes mellitus without complication (HCC)    Diverticulitis    Hypertension    Past Surgical History:  Procedure Laterality Date   APPENDECTOMY     FRACTURE SURGERY     Current Outpatient Medications on File Prior to Visit  Medication Sig Dispense Refill    atorvastatin (LIPITOR) 20 MG tablet Take 1 tablet (20 mg total) by mouth daily. 100 tablet 1   lisinopril (ZESTRIL) 10 MG tablet Take 1 tablet (10 mg total) by mouth daily. 90 tablet 1   No current facility-administered medications on file prior to visit.   No Known Allergies Social History   Socioeconomic History   Marital status: Married    Spouse name: Not on file  Number of children: Not on file   Years of education: Not on file   Highest education level: Not on file  Occupational History   Not on file  Tobacco Use   Smoking status: Former    Current packs/day: 0.00    Types: Cigarettes    Quit date: 02/21/1998    Years since quitting: 25.3   Smokeless tobacco: Never  Substance and Sexual Activity   Alcohol use: Yes   Drug use: No   Sexual activity: Not on file  Other Topics Concern   Not on file  Social History Narrative   Not on file   Social Drivers of Health   Financial Resource Strain: Low Risk  (01/22/2021)   Overall Financial Resource Strain (CARDIA)    Difficulty of Paying Living Expenses: Not hard at all  Food Insecurity: No Food Insecurity (01/22/2021)   Hunger Vital Sign    Worried About Running Out of Food in the Last Year: Never true    Ran Out of Food in the Last Year: Never true  Transportation Needs: No Transportation Needs (01/22/2021)   PRAPARE - Administrator, Civil Service (Medical): No    Lack of Transportation (Non-Medical): No  Physical Activity: Sufficiently Active (01/22/2021)   Exercise Vital Sign    Days of Exercise per Week: 5 days    Minutes of Exercise per Session: 30 min  Stress: No Stress Concern Present (01/22/2021)   [employer] of Occupational Health - Occupational Stress Questionnaire    Feeling of Stress : Not at all  Social Connections: Moderately Isolated (01/22/2021)   Social Connection and Isolation Panel [NHANES]    Frequency of Communication with Friends and Family: More than three times a week     Frequency of Social Gatherings with Friends and Family: More than three times a week    Attends Religious Services: Never    Database administrator or Organizations: No    Attends Banker Meetings: Never    Marital Status: Married  Catering manager Violence: Not At Risk (01/22/2021)   Humiliation, Afraid, Rape, and Kick questionnaire    Fear of Current or Ex-Partner: No    Emotionally Abused: No    Physically Abused: No    Sexually Abused: No      Review of Systems  All other systems reviewed and are negative.      Objective:   Physical Exam Vitals reviewed.  Constitutional:      Appearance: Normal appearance. He is normal weight.  Cardiovascular:     Rate and Rhythm: Normal rate and regular rhythm.     Pulses: Normal pulses.     Heart sounds: Normal heart sounds. No murmur heard.    No friction rub. No gallop.  Pulmonary:     Effort: Pulmonary effort is normal. No respiratory distress.     Breath sounds: Normal breath sounds. No stridor. No wheezing or rhonchi.  Musculoskeletal:       Back:  Neurological:     Mental Status: He is alert.           Assessment & Plan:  Inflamed sebaceous cyst I anesthetized the skin with 0.1% lidocaine with epinephrine.  I made a 1 cm vertical cruciate incision in the center of the cyst.  I expressed copious cyst sac contents.  I then grasped the cyst wall with a pair of hemostats and bluntly dissected it from the surrounding tissue with a scalpel.  I removed the majority of  the cyst sac down to the subcutaneous fat.  The wound was then packed with 8 inches of 1/4 inch iodoform gauze.  Wound care was discussed.  Allow closure through secondary intention over a period of 2 weeks.  I then anesthetized the skin tag on his right collarbone.  I remove that with a pair of scissors.  We discussed his lab work however we deferred the remainder of his physical exam due to time constraints

## 2023-07-06 ENCOUNTER — Telehealth: Payer: Self-pay

## 2023-07-06 NOTE — Telephone Encounter (Signed)
 Copied from CRM [id number]. Topic: General - Other >> Jul 06, 2023  9:38 AM Baldemar Lev wrote: Reason for CRM: Pt called requesting to speak to his PCP, he declined appt offer   Best contact: [phone]

## 2023-07-07 ENCOUNTER — Other Ambulatory Visit: Payer: Self-pay | Admitting: Family Medicine

## 2023-07-07 DIAGNOSIS — I10 Essential (primary) hypertension: Secondary | ICD-10-CM

## 2023-07-07 NOTE — Telephone Encounter (Signed)
 Copied from CRM [id number]. Topic: Clinical - Medication Refill >> Jul 07, 2023  9:24 AM Felecia Hopper H wrote: Most Recent Primary Care Visit:  Provider: Eliane Grooms T  Department: [facility]  Visit Type: PHYSICAL  Date: 06/13/2023  Medication: tirzepatide (MOUNJARO) 2.5 MG/0.5ML Pen [[id number]] lisinopril (ZESTRIL) 10 MG tablet [[id number]] atorvastatin (LIPITOR) 20 MG tablet [[id number]]    Has the patient contacted their pharmacy? No (Agent: If no, request that the patient contact the pharmacy for the refill. If patient does not wish to contact the pharmacy document the reason why and proceed with request.) (Agent: If yes, when and what did the pharmacy advise?) patient has had not problems with the medication and dosage can go up  Is this the correct pharmacy for this prescription? Yes If no, delete pharmacy and type the correct one.  This is the patient's preferred pharmacy:    [facility] - [place], [place] - [address] [place] [address] Phone: [phone] Fax: [phone]     Has the prescription been filled recently? No  Is the patient out of the medication? No  Has the patient been seen for an appointment in the last year OR does the patient have an upcoming appointment? Yes  Can we respond through MyChart? Yes  Agent: Please be advised that Rx refills may take up to 3 business days. We ask that you follow-up with your pharmacy.

## 2023-07-08 MED ORDER — ATORVASTATIN CALCIUM 20 MG PO TABS: 20.0000 mg | ORAL_TABLET | Freq: Every day | ORAL | 0 refills | Status: DC

## 2023-07-08 MED ORDER — TIRZEPATIDE 2.5 MG/0.5ML ~~LOC~~ SOAJ: 2.5000 mg | SUBCUTANEOUS | 1 refills | Status: DC

## 2023-07-08 MED ORDER — LISINOPRIL 10 MG PO TABS: 10.0000 mg | ORAL_TABLET | Freq: Every day | ORAL | 0 refills | Status: DC

## 2023-07-08 NOTE — Telephone Encounter (Signed)
 Refilling medication to [facility], last RF at [facility]. Preferred pharmacy on request is [facility].  Requested Prescriptions  Pending Prescriptions Disp Refills   tirzepatide (MOUNJARO) 2.5 MG/0.5ML Pen 2 mL 1    Sig: Inject 2.5 mg into the skin once a week.     Off-Protocol Failed - 07/08/2023  9:42 AM      Failed - Medication not assigned to a protocol, review manually.      Failed - Valid encounter within last 12 months    Recent Outpatient Visits           3 weeks ago Inflamed sebaceous cyst   [place] [facility] Pickard, Cisco Crest, MD   1 month ago Primary hypertension   [place] [facility] Gwen Lek, Schuyler Custard, FNP               lisinopril  (ZESTRIL ) 10 MG tablet 90 tablet 1    Sig: Take 1 tablet (10 mg total) by mouth daily.     Cardiovascular:  ACE Inhibitors Failed - 07/08/2023  9:42 AM      Failed - Last BP in normal range    BP Readings from Last 1 Encounters:  06/13/23 (!) 152/72         Failed - Valid encounter within last 6 months    Recent Outpatient Visits           3 weeks ago Inflamed sebaceous cyst   [place] [facility] Austine Lefort, MD   1 month ago Primary hypertension   [place] [facility] Jenelle Mis, FNP              Passed - Cr in normal range and within 180 days    Creat  Date Value Ref Range Status  06/01/2023 1.12 0.70 - 1.28 mg/dL Final         Passed - K in normal range and within 180 days    Potassium  Date Value Ref Range Status  06/01/2023 4.2 3.5 - 5.3 mmol/L Final         Passed - Patient is not pregnant       atorvastatin  (LIPITOR) 20 MG tablet 100 tablet 1    Sig: Take 1 tablet (20 mg total) by mouth daily.     Cardiovascular:  Antilipid - Statins Failed - 07/08/2023  9:42 AM      Failed - Valid encounter within last 12 months    Recent Outpatient Visits           3 weeks ago Inflamed sebaceous cyst   [place] [facility] Pickard, Cisco Crest, MD   1 month ago Primary hypertension   [place] [facility] Yolanda Hence S, FNP              Failed - Lipid Panel in normal range within the last 12 months    Cholesterol  Date Value Ref Range Status  06/01/2023 132 <200 mg/dL Final   LDL Cholesterol (Calc)  Date Value Ref Range Status  06/01/2023 65 mg/dL (calc) Final    Comment:    Reference range: <100 . Desirable range <100 mg/dL for primary prevention;   <70 mg/dL for patients with CHD or diabetic patients  with > or = 2 CHD risk factors. Aaron Aas LDL-C is now calculated using the Martin-Hopkins  calculation, which is a validated novel method providing  better accuracy than  the Friedewald equation in the  estimation of LDL-C.  Melinda Sprawls et al. Erroll Heard. 8119;147(82): 2061-2068  (http://education.[facility].com/faq/FAQ164)    HDL  Date Value Ref Range Status  06/01/2023 44 > OR = 40 mg/dL Final   Triglycerides  Date Value Ref Range Status  06/01/2023 155 (H) <150 mg/dL Final         Passed - Patient is not pregnant

## 2023-07-08 NOTE — Telephone Encounter (Signed)
 Requested medication (s) are due for refill today: yes, another pharmacy  Requested medication (s) are on the active medication list: yes  Last refill:  06/14/23  Future visit scheduled: yes  Notes to clinic:  Medication not assigned to a protocol, review manually. Resend medication to [facility] instead of [facility].     Requested Prescriptions  Pending Prescriptions Disp Refills   tirzepatide (MOUNJARO) 2.5 MG/0.5ML Pen 2 mL 1    Sig: Inject 2.5 mg into the skin once a week.     Off-Protocol Failed - 07/08/2023  9:44 AM      Failed - Medication not assigned to a protocol, review manually.      Failed - Valid encounter within last 12 months    Recent Outpatient Visits           3 weeks ago Inflamed sebaceous cyst   [facility] Pickard, Cisco Crest, MD   1 month ago Primary hypertension   [place] [facility] Jenelle Mis, FNP              Signed Prescriptions Disp Refills   lisinopril  (ZESTRIL ) 10 MG tablet 90 tablet 0    Sig: Take 1 tablet (10 mg total) by mouth daily.     Cardiovascular:  ACE Inhibitors Failed - 07/08/2023  9:44 AM      Failed - Last BP in normal range    BP Readings from Last 1 Encounters:  06/13/23 (!) 152/72         Failed - Valid encounter within last 6 months    Recent Outpatient Visits           3 weeks ago Inflamed sebaceous cyst   [facility] Medicine Austine Lefort, MD   1 month ago Primary hypertension   [place] [facility] Jenelle Mis, FNP              Passed - Cr in normal range and within 180 days    Creat  Date Value Ref Range Status  06/01/2023 1.12 0.70 - 1.28 mg/dL Final         Passed - K in normal range and within 180 days    Potassium  Date Value Ref Range Status  06/01/2023 4.2 3.5 - 5.3 mmol/L Final         Passed - Patient is not pregnant       atorvastatin  (LIPITOR) 20 MG tablet 100 tablet 0    Sig:  Take 1 tablet (20 mg total) by mouth daily.     Cardiovascular:  Antilipid - Statins Failed - 07/08/2023  9:44 AM      Failed - Valid encounter within last 12 months    Recent Outpatient Visits           3 weeks ago Inflamed sebaceous cyst   [place] [facility] Pickard, Cisco Crest, MD   1 month ago Primary hypertension   [place] [facility] Yolanda Hence S, FNP              Failed - Lipid Panel in normal range within the last 12 months    Cholesterol  Date Value Ref Range Status  06/01/2023 132 <200 mg/dL Final   LDL Cholesterol (Calc)  Date Value Ref Range Status  06/01/2023 65 mg/dL (calc) Final    Comment:    Reference range: <100 . Desirable range <100  mg/dL for primary prevention;   <70 mg/dL for patients with CHD or diabetic patients  with > or = 2 CHD risk factors. Aaron Aas LDL-C is now calculated using the Martin-Hopkins  calculation, which is a validated novel method providing  better accuracy than the Friedewald equation in the  estimation of LDL-C.  Melinda Sprawls et al. Erroll Heard. 4098;119(14): 2061-2068  (http://education.[facility].com/faq/FAQ164)    HDL  Date Value Ref Range Status  06/01/2023 44 > OR = 40 mg/dL Final   Triglycerides  Date Value Ref Range Status  06/01/2023 155 (H) <150 mg/dL Final         Passed - Patient is not pregnant

## 2023-07-08 NOTE — Telephone Encounter (Signed)
 Requested Prescriptions  Pending Prescriptions Disp Refills   tirzepatide (MOUNJARO) 2.5 MG/0.5ML Pen 2 mL 1    Sig: Inject 2.5 mg into the skin once a week.     Off-Protocol Failed - 07/08/2023  9:44 AM      Failed - Medication not assigned to a protocol, review manually.      Failed - Valid encounter within last 12 months    Recent Outpatient Visits           3 weeks ago Inflamed sebaceous cyst   [place] [facility] Pickard, Cisco Crest, MD   1 month ago Primary hypertension   [place] [facility] Jenelle Mis, FNP               lisinopril  (ZESTRIL ) 10 MG tablet 90 tablet 0    Sig: Take 1 tablet (10 mg total) by mouth daily.     Cardiovascular:  ACE Inhibitors Failed - 07/08/2023  9:44 AM      Failed - Last BP in normal range    BP Readings from Last 1 Encounters:  06/13/23 (!) 152/72         Failed - Valid encounter within last 6 months    Recent Outpatient Visits           3 weeks ago Inflamed sebaceous cyst   [place] [facility] Medicine Austine Lefort, MD   1 month ago Primary hypertension   [place] [facility] Jenelle Mis, FNP              Passed - Cr in normal range and within 180 days    Creat  Date Value Ref Range Status  06/01/2023 1.12 0.70 - 1.28 mg/dL Final         Passed - K in normal range and within 180 days    Potassium  Date Value Ref Range Status  06/01/2023 4.2 3.5 - 5.3 mmol/L Final         Passed - Patient is not pregnant      Signed Prescriptions Disp Refills   atorvastatin  (LIPITOR) 20 MG tablet 100 tablet 0    Sig: Take 1 tablet (20 mg total) by mouth daily.     Cardiovascular:  Antilipid - Statins Failed - 07/08/2023  9:44 AM      Failed - Valid encounter within last 12 months    Recent Outpatient Visits           3 weeks ago Inflamed sebaceous cyst   [place] [facility] Pickard, Cisco Crest, MD   1 month ago Primary  hypertension   [place] [facility] Yolanda Hence S, FNP              Failed - Lipid Panel in normal range within the last 12 months    Cholesterol  Date Value Ref Range Status  06/01/2023 132 <200 mg/dL Final   LDL Cholesterol (Calc)  Date Value Ref Range Status  06/01/2023 65 mg/dL (calc) Final    Comment:    Reference range: <100 . Desirable range <100 mg/dL for primary prevention;   <70 mg/dL for patients with CHD or diabetic patients  with > or = 2 CHD risk factors. Aaron Aas LDL-C is now calculated using the Martin-Hopkins  calculation, which is a validated novel method providing  better accuracy than the Friedewald equation in the  estimation of LDL-C.  Melinda Sprawls et  al. JAMA. 1096;045(40): 2061-2068  (http://education.[facility].com/faq/FAQ164)    HDL  Date Value Ref Range Status  06/01/2023 44 > OR = 40 mg/dL Final   Triglycerides  Date Value Ref Range Status  06/01/2023 155 (H) <150 mg/dL Final         Passed - Patient is not pregnant

## 2023-08-04 ENCOUNTER — Ambulatory Visit

## 2023-08-10 ENCOUNTER — Other Ambulatory Visit: Payer: Self-pay

## 2023-08-10 ENCOUNTER — Telehealth: Payer: Self-pay | Admitting: Family Medicine

## 2023-08-10 DIAGNOSIS — E785 Hyperlipidemia, unspecified: Secondary | ICD-10-CM

## 2023-08-10 MED ORDER — TIRZEPATIDE 5 MG/0.5ML ~~LOC~~ SOAJ: 5.0000 mg | SUBCUTANEOUS | 0 refills | Status: DC

## 2023-08-10 NOTE — Telephone Encounter (Signed)
 Patient requesting refill of Mounjaro and dose increase. Please advise.    Copied from CRM [id number]. Topic: Clinical - Medication Question >> Aug 10, 2023  9:35 AM [name] wrote: Reason for CRM: Patient requesting dosage increase on medication, tirzepatide (MOUNJARO) 2.5 MG/0.5ML Pen. He states he is out of his medication and needs it sent as soon as possible to pharmacy.   [facility] - [place]), [place] - [address] [place] ([place]) [place] [address] Phone: [phone] Fax: [phone] Hours: Not open 24 hours

## 2023-08-17 LAB — FECAL OCCULT BLOOD, IMMUNOCHEMICAL: IFOBT: NEGATIVE

## 2023-08-31 ENCOUNTER — Ambulatory Visit

## 2023-08-31 DIAGNOSIS — Z23 Encounter for immunization: Secondary | ICD-10-CM

## 2023-08-31 NOTE — Progress Notes (Signed)
 Patient is in office today for a nurse visit for Shingrix  #2 Immunization. Patient Injection was given in the  Right deltoid. Patient tolerated injection well.  Vallerie Gave, CMA

## 2023-09-07 ENCOUNTER — Telehealth: Payer: Self-pay

## 2023-09-07 ENCOUNTER — Other Ambulatory Visit: Payer: Self-pay | Admitting: Family Medicine

## 2023-09-07 DIAGNOSIS — E785 Hyperlipidemia, unspecified: Secondary | ICD-10-CM

## 2023-09-07 NOTE — Telephone Encounter (Signed)
 Copied from CRM [id number]. Topic: Clinical - Medication Refill >> Sep 07, 2023  9:43 AM Hamp Levine R wrote: Patient states he is out of the medication and it was supposed to be sent for a refill when he was at the office on 06/11. He would like a 90 day [facility] and would like [facility] to [facility] him [facility] til the rest arrives.  Medication: tirzepatide  (MOUNJARO ) 5 MG/0.5ML Pen (Would like a 90 day supply if possible)  Has the patient contacted their pharmacy? Yes, call dr  This is the patient's preferred pharmacy:  [facility] - [place], [place] - [address] [facility] [address] [place] [address] [place] [address] Phone: [phone] Fax: [phone]  [facility] [place] ([place]), [place] - [address] [place] ([place]) [place] [address] Phone: [phone] Fax: [phone]  Is this the correct pharmacy for this prescription? Yes If no, delete pharmacy and type the correct one.   Has the prescription been filled recently? No  Is the patient out of the medication? Yes  Has the patient been seen for an appointment in the last year OR does the patient have an upcoming appointment? Yes  Can we respond through MyChart? Yes  Agent: Please be advised that Rx refills may take up to 3 business days. We ask that you follow-up with your pharmacy.

## 2023-09-07 NOTE — Telephone Encounter (Unsigned)
 Copied from CRM [id number]. Topic: Clinical - Medication Refill >> Sep 07, 2023  9:43 AM Hamp Levine R wrote: Patient states he is out of the medication and it was supposed to be sent for a refill when he was at the office on 06/11. He would like a 90 day supply [facility] and would [facility] til the rest arrives.  Medication: tirzepatide  (MOUNJARO ) 5 MG/0.5ML Pen (Would like a 90 day supply if possible)  Has the patient contacted their pharmacy? Yes, call dr  This is the patient's preferred pharmacy:  [facility] - [place], [place] - [address] [facility] [address] [place] [address] [place] [address] Phone: [phone] Fax: [phone]  [facility] [place] ([place]), [place] - [address] [place] ([place]) [place] [address] Phone: [phone] Fax: [phone]  Is this the correct pharmacy for this prescription? Yes If no, delete pharmacy and type the correct one.   Has the prescription been filled recently? No  Is the patient out of the medication? Yes  Has the patient been seen for an appointment in the last year OR does the patient have an upcoming appointment? Yes  Can we respond through MyChart? Yes  Agent: Please be advised that Rx refills may take up to 3 business days. We ask that you follow-up with your pharmacy. >> Sep 07, 2023  3:56 PM Felizardo Hotter wrote: Pt asked that he is called [phone] once medication is sent to pharmacy.  >> Sep 07, 2023  3:49 PM Felizardo Hotter wrote: Pt needs medication tirzepatide  (MOUNJARO ) 5 MG/0.5ML Pen sent to [facility] - [place] ([place]), [place] - [address] [place] ([place]) [place] [address] Phone: [phone] Fax: [phone]. Pt states pharmacy have not receive medication. I told pt it may take up to 3 business day.

## 2023-09-08 ENCOUNTER — Other Ambulatory Visit: Payer: Self-pay

## 2023-09-08 DIAGNOSIS — E785 Hyperlipidemia, unspecified: Secondary | ICD-10-CM

## 2023-09-08 MED ORDER — TIRZEPATIDE 5 MG/0.5ML ~~LOC~~ SOAJ: 5.0000 mg | SUBCUTANEOUS | 0 refills | Status: DC

## 2023-09-09 NOTE — Telephone Encounter (Signed)
 Requested medication (s) are due for refill today:   Yes  Requested medication (s) are on the active medication list:   Yes  Future visit scheduled:   No.  LOV 06/13/2023 by Dr. Cheril Cork   Last ordered: 09/08/2023 2 ml, 0 refills.    See note dated 09/07/2023 at 4:35 PM.  Unable to refill because no protocol assigned to this medication.    Needing pt response to MyChart message which pharmacy he wants to use, mail order or local.      Requested Prescriptions  Pending Prescriptions Disp Refills   tirzepatide  (MOUNJARO ) 5 MG/0.5ML Pen 2 mL 0    Sig: Inject 5 mg into the skin once a week.     Off-Protocol Failed - 09/09/2023 12:22 PM      Failed - Medication not assigned to a protocol, review manually.      Failed - Valid encounter within last 12 months    Recent Outpatient Visits           2 months ago Inflamed sebaceous cyst   [place] [facility] Pickard, Cisco Crest, MD   3 months ago Primary hypertension   [place] [facility] Jenelle Mis, FNP

## 2023-09-12 ENCOUNTER — Other Ambulatory Visit: Payer: Self-pay | Admitting: Family Medicine

## 2023-09-12 DIAGNOSIS — E785 Hyperlipidemia, unspecified: Secondary | ICD-10-CM

## 2023-09-27 ENCOUNTER — Other Ambulatory Visit: Payer: Self-pay | Admitting: Family Medicine

## 2023-09-27 DIAGNOSIS — E785 Hyperlipidemia, unspecified: Secondary | ICD-10-CM

## 2023-10-11 ENCOUNTER — Other Ambulatory Visit: Payer: Self-pay | Admitting: Family Medicine

## 2023-10-11 DIAGNOSIS — I10 Essential (primary) hypertension: Secondary | ICD-10-CM

## 2023-10-21 ENCOUNTER — Other Ambulatory Visit: Payer: Self-pay | Admitting: Family Medicine

## 2023-10-21 DIAGNOSIS — I10 Essential (primary) hypertension: Secondary | ICD-10-CM

## 2023-10-23 DIAGNOSIS — M79672 Pain in left foot: Secondary | ICD-10-CM | POA: Diagnosis not present

## 2023-10-24 DIAGNOSIS — M79672 Pain in left foot: Secondary | ICD-10-CM | POA: Diagnosis not present

## 2023-11-01 ENCOUNTER — Ambulatory Visit: Admitting: Podiatry

## 2023-11-01 ENCOUNTER — Encounter: Payer: Self-pay | Admitting: Podiatry

## 2023-11-01 ENCOUNTER — Ambulatory Visit (INDEPENDENT_AMBULATORY_CARE_PROVIDER_SITE_OTHER)

## 2023-11-01 DIAGNOSIS — M79672 Pain in left foot: Secondary | ICD-10-CM | POA: Diagnosis not present

## 2023-11-01 DIAGNOSIS — M778 Other enthesopathies, not elsewhere classified: Secondary | ICD-10-CM

## 2023-11-01 DIAGNOSIS — M722 Plantar fascial fibromatosis: Secondary | ICD-10-CM

## 2023-11-01 DIAGNOSIS — M7752 Other enthesopathy of left foot: Secondary | ICD-10-CM | POA: Diagnosis not present

## 2023-11-01 DIAGNOSIS — M7732 Calcaneal spur, left foot: Secondary | ICD-10-CM | POA: Diagnosis not present

## 2023-11-01 MED ORDER — TRIAMCINOLONE ACETONIDE 10 MG/ML IJ SUSP
10.0000 mg | Freq: Once | INTRAMUSCULAR | Status: AC
  Administered 2023-11-01 (×2): 10 mg

## 2023-11-01 NOTE — Progress Notes (Signed)
 Patient presents with complaint of pain in the left foot.  Pain at the heel and along the arch of the foot especially as the day goes on.  Pain when he first stands on it.  Does not recall any injury to the area.  Has not noticed any redness or ecchymosis.   Physical exam:  General appearance: Pleasant, and in no acute distress. AOx3.  Vascular: Pedal pulses: DP 2/4 bilaterally, PT 2/4 bilaterally. Mild edema lower legs bilaterally. Capillary fill time immediate bilateral.  Neurological: Light touch intact feet bilaterally.  Normal Achilles reflex bilaterally.  No clonus or spasticity noted.  Negative Tinel's sign tarsal tunnel bilaterally  Dermatologic:   Skin normal temperature bilaterally.  Skin normal color, tone, and texture bilaterally.   Musculoskeletal: Tenderness plantar medial aspect of the heel left with no tenderness with lateral compression calcaneus.  Tenderness along the medial edge of the plantar fascia.  No fibromas.  Radiographs: 3 views foot left: Osteophytic changes plantar calcaneal tubercle.  No erosive changes or evidence of stress fractures.  Normal bone density.  Diagnosis: 1.  Pain foot left 2.  Plantar fasciitis left. 3.  Calcaneal spur left.  Plan: -New patient office visit for evaluation and management level 3.  Modifier 25. - Discussed plantar fasciitis and etiology and treatment.  Will try night splint and some OTC orthotics.  Discussed wearing proper shoes.  Avoid flat soled shoes.  Discussed custom orthotics with this continues to be a problem. - Gave written and oral instructions for at home PT he can do. - Dispensed night splint to left - Dispensed OTC orthotics feet bilaterally  -injected 3cc 2:1 mixture 0.5 cc Marcaine:Kenolog 10mg /66ml at origin plantar fascia at medial calcaneal tubercle left   Return weeks follow-up injection plantar fascia left

## 2023-11-15 ENCOUNTER — Ambulatory Visit: Admitting: Podiatry

## 2023-12-05 ENCOUNTER — Other Ambulatory Visit: Payer: Self-pay | Admitting: Family Medicine

## 2023-12-05 DIAGNOSIS — I10 Essential (primary) hypertension: Secondary | ICD-10-CM

## 2023-12-05 NOTE — Telephone Encounter (Unsigned)
 Copied from CRM [id number]. Topic: Clinical - Medication Refill >> Dec 05, 2023  3:15 PM Carlatta H wrote: Medication:  lisinopril  (ZESTRIL ) 10 MG tablet [[id number]] Has the patient contacted their pharmacy? Yes (Agent: If no, request that the patient contact the pharmacy for the refill. If patient does not wish to contact the pharmacy document the reason why and proceed with request.) (Agent: If yes, when and what did the pharmacy advise?) contact office  This is the patient's preferred pharmacy:    [facility] - [place] - [address] [place] ([place]) [place] [address] Phone: [phone] Fax: [phone]  Is this the correct pharmacy for this prescription? Yes If no, delete pharmacy and type the correct one.   Has the prescription been filled recently? No  Is the patient out of the medication? Yes  Has the patient been seen for an appointment in the last year OR does the patient have an upcoming appointment? Yes  Can we respond through MyChart? Yes  Agent: Please be advised that Rx refills may take up to 3 business days. We ask that you follow-up with your pharmacy.

## 2023-12-07 NOTE — Telephone Encounter (Signed)
 Too soon for refill.  Requested Prescriptions  Pending Prescriptions Disp Refills   lisinopril  (ZESTRIL ) 10 MG tablet 90 tablet 3    Sig: Take 1 tablet (10 mg total) by mouth daily.     Cardiovascular:  ACE Inhibitors Failed - 12/07/2023  8:55 AM      Failed - Cr in normal range and within 180 days    Creat  Date Value Ref Range Status  06/01/2023 1.12 0.70 - 1.28 mg/dL Final         Failed - K in normal range and within 180 days    Potassium  Date Value Ref Range Status  06/01/2023 4.2 3.5 - 5.3 mmol/L Final         Failed - Last BP in normal range    BP Readings from Last 1 Encounters:  06/13/23 (!) 152/72         Passed - Patient is not pregnant      Passed - Valid encounter within last 6 months    Recent Outpatient Visits           5 months ago Inflamed sebaceous cyst   [place] [facility] Duanne Butler DASEN, MD   6 months ago Primary hypertension    [facility] Kayla Jeoffrey RAMAN, [place]

## 2023-12-21 ENCOUNTER — Ambulatory Visit (INDEPENDENT_AMBULATORY_CARE_PROVIDER_SITE_OTHER)

## 2023-12-21 VITALS — Ht 71.0 in | Wt 281.0 lb

## 2023-12-21 DIAGNOSIS — Z0000 Encounter for general adult medical examination without abnormal findings: Secondary | ICD-10-CM

## 2023-12-21 DIAGNOSIS — I10 Essential (primary) hypertension: Secondary | ICD-10-CM

## 2023-12-21 MED ORDER — LISINOPRIL 10 MG PO TABS: 10.0000 mg | ORAL_TABLET | Freq: Every day | ORAL | 3 refills | Status: AC

## 2023-12-21 MED ORDER — ATORVASTATIN CALCIUM 20 MG PO TABS: 20.0000 mg | ORAL_TABLET | Freq: Every day | ORAL | 2 refills | Status: AC

## 2023-12-21 NOTE — Patient Instructions (Signed)
 Keith Day,  Thank you for taking the time for your Medicare Wellness Visit. I appreciate your continued commitment to your health goals. Please review the care plan we discussed, and feel free to reach out if I can assist you further.  Medicare recommends these wellness visits once per year to help you and your care team stay ahead of potential health issues. These visits are designed to focus on prevention, allowing your provider to concentrate on managing your acute and chronic conditions during your regular appointments.  Please note that Annual Wellness Visits do not include a physical exam. Some assessments may be limited, especially if the visit was conducted virtually. If needed, we may recommend a separate in-person follow-up with your provider.  Ongoing Care Seeing your primary care provider every 3 to 6 months helps us  monitor your health and provide consistent, personalized care.   Referrals If a referral was made during today's visit and you haven't received any updates within two weeks, please contact the referred provider directly to check on the status.  Recommended Screenings:  Health Maintenance  Topic Date Due   Flu Shot  10/21/2023   COVID-19 Vaccine (4 - 2025-26 season) 11/21/2023   DTaP/Tdap/Td vaccine (4 - Td or Tdap) 09/25/2024   Medicare Annual Wellness Visit  12/20/2024   Colon Cancer Screening  04/08/2025   Pneumococcal Vaccine for age over 77  Completed   Hepatitis C Screening  Completed   Zoster (Shingles) Vaccine  Completed   HPV Vaccine  Aged Out   Meningitis B Vaccine  Aged Out       12/21/2023   11:53 AM  Advanced Directives  Does Patient Have a Medical Advance Directive? No  Would patient like information on creating a medical advance directive? Yes (MAU/Ambulatory/Procedural Areas - Information given)   Advance Care Planning is important because it: Ensures you receive medical care that aligns with your values, goals, and preferences. Provides  guidance to your family and loved ones, reducing the emotional burden of decision-making during critical moments.  Information on Advanced Care Planning can be found at [place]  Secretary of [facility] Advance Health Care Directives Advance Health Care Directives ([email or web address]   Vision: Annual vision screenings are recommended for early detection of glaucoma, cataracts, and diabetic retinopathy. These exams can also reveal signs of chronic conditions such as diabetes and high blood pressure.  Dental: Annual dental screenings help detect early signs of oral cancer, gum disease, and other conditions linked to overall health, including heart disease and diabetes.  Please see the attached documents for additional preventive care recommendations.

## 2023-12-21 NOTE — Progress Notes (Signed)
 Subjective:   Keith Day is a 73 y.o. who presents for a Medicare Wellness preventive visit.  As a reminder, Annual Wellness Visits don't include a physical exam, and some assessments may be limited, especially if this visit is performed virtually. We may recommend an in-person follow-up visit with your provider if needed.  Visit Complete: Virtual I connected with  Keith Day on 12/21/23 by a audio enabled telemedicine application and verified that I am speaking with the correct person using two identifiers.  Patient Location: Home  Provider Location: Home Office  I discussed the limitations of evaluation and management by telemedicine. The patient expressed understanding and agreed to proceed.  Vital Signs: Because this visit was a virtual/telehealth visit, some criteria may be missing or patient reported. Any vitals not documented were not able to be obtained and vitals that have been documented are patient reported.  VideoDeclined- This patient declined Librarian, academic. Therefore the visit was completed with audio only.  Persons Participating in Visit: Patient.  AWV Questionnaire: No: Patient Medicare AWV questionnaire was not completed prior to this visit.  Cardiac Risk Factors include: advanced age (>26men, >33 women);dyslipidemia;hypertension;male gender     Objective:    Today's Vitals   12/21/23 1150  Weight: 281 lb (127.5 kg)  Height: 5' 11 (1.803 m)   Body mass index is 39.19 kg/m.     12/21/2023   11:53 AM 01/22/2021    9:11 AM 09/26/2014   12:00 PM  Advanced Directives  Does Patient Have a Medical Advance Directive? No Yes No   Type of Special educational needs teacher of [place];Living will   Copy of Healthcare Power of Attorney in Chart?  No - copy requested   Would patient like information on creating a medical advance directive? Yes (MAU/Ambulatory/Procedural Areas - Information given)  No - patient declined  information      Data saved with a previous flowsheet row definition    Current Medications (verified) Outpatient Encounter Medications as of 12/21/2023  Medication Sig   MOUNJARO  5 MG/0.5ML Pen INJECT THE CONTENTS OF ONE PEN  SUBCUTANEOUSLY WEEKLY AS  DIRECTED   [DISCONTINUED] atorvastatin  (LIPITOR) 20 MG tablet TAKE 1 TABLET BY MOUTH DAILY   [DISCONTINUED] lisinopril  (ZESTRIL ) 10 MG tablet TAKE 1 TABLET BY MOUTH DAILY   atorvastatin  (LIPITOR) 20 MG tablet Take 1 tablet (20 mg total) by mouth daily.   lisinopril  (ZESTRIL ) 10 MG tablet Take 1 tablet (10 mg total) by mouth daily.   No facility-administered encounter medications on file as of 12/21/2023.    Allergies (verified) Patient has no known allergies.   History: Past Medical History:  Diagnosis Date   Colon polyps    Diabetes mellitus without complication (HCC)    Diverticulitis    Hypertension    Past Surgical History:  Procedure Laterality Date   APPENDECTOMY     FRACTURE SURGERY     Family History  Problem Relation Age of Onset   Cancer Father 19       Lung Cancer   Social History   Socioeconomic History   Marital status: Married    Spouse name: Not on file   Number of children: Not on file   Years of education: Not on file   Highest education level: Not on file  Occupational History   Not on file  Tobacco Use   Smoking status: Former    Current packs/day: 0.00    Types: Cigarettes    Quit date:  02/21/1998    Years since quitting: 25.8   Smokeless tobacco: Never  Substance and Sexual Activity   Alcohol use: Yes   Drug use: No   Sexual activity: Not on file  Other Topics Concern   Not on file  Social History Narrative   Not on file   Social Drivers of Health   Financial Resource Strain: Low Risk  (12/21/2023)   Overall Financial Resource Strain (CARDIA)    Difficulty of Paying Living Expenses: Not hard at all  Food Insecurity: No Food Insecurity (12/21/2023)   Hunger Vital Sign    Worried  About Running Out of Food in the Last Year: Never true    Ran Out of Food in the Last Year: Never true  Transportation Needs: No Transportation Needs (12/21/2023)   PRAPARE - Administrator, Civil Service (Medical): No    Lack of Transportation (Non-Medical): No  Physical Activity: Sufficiently Active (12/21/2023)   Exercise Vital Sign    Days of Exercise per Week: 5 days    Minutes of Exercise per Session: 30 min  Stress: No Stress Concern Present (12/21/2023)   [employer] of Occupational Health - Occupational Stress Questionnaire    Feeling of Stress: Not at all  Social Connections: Moderately Isolated (12/21/2023)   Social Connection and Isolation Panel    Frequency of Communication with Friends and Family: More than three times a week    Frequency of Social Gatherings with Friends and Family: More than three times a week    Attends Religious Services: Never    Database administrator or Organizations: No    Attends Engineer, structural: Never    Marital Status: Married    Tobacco Counseling Counseling given: Not Answered    Clinical Intake:  Pre-visit preparation completed: Yes  Pain : No/denies pain  Diabetes: No  Lab Results  Component Value Date   HGBA1C 5.9 (H) 06/01/2023     How often do you need to have someone help you when you read instructions, pamphlets, or other written materials from your doctor or pharmacy?: 1 - Never  Interpreter Needed?: No  Information entered by :: Charmaine Bloodgood LPN   Activities of Daily Living     12/21/2023   11:51 AM  In your present state of health, do you have any difficulty performing the following activities:  Hearing? 0  Vision? 0  Difficulty concentrating or making decisions? 0  Walking or climbing stairs? 0  Dressing or bathing? 0  Doing errands, shopping? 0  Preparing Food and eating ? N  Using the Toilet? N  In the past six months, have you accidently leaked urine? N  Do you have  problems with loss of bowel control? N  Managing your Medications? N  Managing your Finances? N  Housekeeping or managing your Housekeeping? N    Patient Care Team: Duanne Butler DASEN, MD as PCP - General (Family Medicine)  I have updated your Care Teams any recent Medical Services you may have received from other providers in the past year.     Assessment:   This is a routine wellness examination for [place].  Hearing/Vision screen Hearing Screening - Comments:: Denies hearing difficulties   Vision Screening - Comments:: Wears rx glasses - up to date with routine eye exams    Goals Addressed             This Visit's Progress    Maintain health and independence   On track  Depression Screen     12/21/2023   11:53 AM 06/01/2023   10:13 AM 01/22/2022    8:49 AM 01/22/2021    9:07 AM 04/11/2020    4:31 PM 07/13/2017    8:01 AM  PHQ 2/9 Scores  PHQ - 2 Score 0 0 0 0 0 0  PHQ- 9 Score   0       Fall Risk     12/21/2023   11:54 AM 06/01/2023   10:13 AM 01/22/2022    8:49 AM 01/22/2021    9:12 AM 04/11/2020    4:31 PM  Fall Risk   Falls in the past year? 0 0 0 0 0  Number falls in past yr: 0 0  0   Injury with Fall? 0 0  0   Risk for fall due to : No Fall Risks No Fall Risks  No Fall Risks No Fall Risks  Follow up Falls prevention discussed;Education provided;Falls evaluation completed Falls prevention discussed;Falls evaluation completed  Falls prevention discussed  Falls evaluation completed      Data saved with a previous flowsheet row definition    MEDICARE RISK AT HOME:  Medicare Risk at Home Any stairs in or around the home?: No If so, are there any without handrails?: No Home free of loose throw rugs in walkways, pet beds, electrical cords, etc?: Yes Adequate lighting in your home to reduce risk of falls?: Yes Life alert?: No Use of a cane, walker or w/c?: No Grab bars in the bathroom?: Yes Shower chair or bench in shower?: No Elevated toilet seat or a  handicapped toilet?: No  TIMED UP AND GO:  Was the test performed?  No  Cognitive Function: 6CIT completed        12/21/2023   11:54 AM 01/22/2021    9:14 AM  6CIT Screen  What Year? 0 points 0 points  What month? 0 points 0 points  What time? 0 points 0 points  Count back from 20 0 points 0 points  Months in reverse 0 points 4 points  Repeat phrase 0 points 0 points  Total Score 0 points 4 points    Immunizations Immunization History  Administered Date(s) Administered   INFLUENZA, HIGH DOSE SEASONAL PF 03/18/2017, 04/21/2018   Influenza,inj,Quad PF,6+ Mos 03/01/2013, 02/02/2016, 04/11/2020   Moderna Sars-Covid-2 Vaccination 05/03/2019, 05/31/2019, 02/25/2020   Pneumococcal Conjugate-13 03/18/2017   Pneumococcal Polysaccharide-23 08/08/2001, 02/02/2016   Td 11/27/1993, 06/26/2009   Tdap 09/26/2014   Zoster Recombinant(Shingrix ) 06/01/2023, 08/31/2023    Screening Tests Health Maintenance  Topic Date Due   Influenza Vaccine  10/21/2023   COVID-19 Vaccine (4 - 2025-26 season) 11/21/2023   DTaP/Tdap/Td (4 - Td or Tdap) 09/25/2024   Medicare Annual Wellness (AWV)  12/20/2024   Colonoscopy  04/08/2025   Pneumococcal Vaccine: 50+ Years  Completed   Hepatitis C Screening  Completed   Zoster Vaccines- Shingrix   Completed   HPV VACCINES  Aged Out   Meningococcal B Vaccine  Aged Out    Health Maintenance Items Addressed: Vaccines Due: Flu  Additional Screening:  Vision Screening: Recommended annual ophthalmology exams for early detection of glaucoma and other disorders of the eye. Is the patient up to date with their annual eye exam?  Yes  Who is the provider or what is the name of the office in which the patient attends annual eye exams?   Dental Screening: Recommended annual dental exams for proper oral hygiene  Community Resource Referral / Chronic Care Management: CRR  required this visit?  No   CCM required this visit?  No   Plan:    I have personally  reviewed and noted the following in the patient's chart:   Medical and social history Use of alcohol, tobacco or illicit drugs  Current medications and supplements including opioid prescriptions. Patient is not currently taking opioid prescriptions. Functional ability and status Nutritional status Physical activity Advanced directives List of other physicians Hospitalizations, surgeries, and ER visits in previous 12 months Vitals Screenings to include cognitive, depression, and falls Referrals and appointments  In addition, I have reviewed and discussed with patient certain preventive protocols, quality metrics, and best practice recommendations. A written personalized care plan for preventive services as well as general preventive health recommendations were provided to patient.   Lavelle Pfeiffer [place], [place]   [id number]   After Visit Summary: (MyChart) Due to this being a telephonic visit, the after visit summary with patients personalized plan was offered to patient via MyChart   Notes: Nothing significant to report at this time.

## 2024-12-26 ENCOUNTER — Ambulatory Visit
